# Patient Record
Sex: Male | Born: 1970
Health system: Southern US, Community
[De-identification: ages and names within clinical notes are randomized; demographics above are authoritative.]

## PROBLEM LIST (undated history)

## (undated) DIAGNOSIS — M19041 Primary osteoarthritis, right hand: Secondary | ICD-10-CM

## (undated) DIAGNOSIS — T7840XA Allergy, unspecified, initial encounter: Secondary | ICD-10-CM

## (undated) DIAGNOSIS — G5600 Carpal tunnel syndrome, unspecified upper limb: Secondary | ICD-10-CM

## (undated) DIAGNOSIS — M19042 Primary osteoarthritis, left hand: Secondary | ICD-10-CM

## (undated) DIAGNOSIS — G4700 Insomnia, unspecified: Secondary | ICD-10-CM

## (undated) HISTORY — PX: CHOLECYSTECTOMY: SHX55

## (undated) HISTORY — DX: Primary osteoarthritis, right hand: M19.041

## (undated) HISTORY — DX: Insomnia, unspecified: G47.00

## (undated) HISTORY — DX: Allergy, unspecified, initial encounter: T78.40XA

## (undated) HISTORY — DX: Primary osteoarthritis, left hand: M19.042

---

## 2016-07-28 ENCOUNTER — Ambulatory Visit (HOSPITAL_COMMUNITY)
Admission: EM | Admit: 2016-07-28 | Discharge: 2016-07-28 | Disposition: A | Discharge status: Home / Self Care | Payer: Self-pay | Attending: Family Medicine | Admitting: Family Medicine

## 2016-07-28 ENCOUNTER — Encounter (HOSPITAL_COMMUNITY): Payer: Self-pay | Admitting: Emergency Medicine

## 2016-07-28 DIAGNOSIS — R0789 Other chest pain: Secondary | ICD-10-CM

## 2016-07-28 DIAGNOSIS — G4700 Insomnia, unspecified: Secondary | ICD-10-CM

## 2016-07-28 MED ORDER — HYDROXYZINE HCL 25 MG PO TABS: ORAL_TABLET | ORAL | 0 refills | Status: DC

## 2016-07-28 MED ORDER — NAPROXEN 500 MG PO TABS: 500.0000 mg | ORAL_TABLET | Freq: Two times a day (BID) | ORAL | 0 refills | Status: DC

## 2016-07-28 NOTE — ED Triage Notes (Signed)
Patient reports intermittent left chest pain.  Pain started 3 days ago.  Patient reports if he lies on his left side, he feels pain.  Patient says if he lies on right side, he does not feel any pain.  Patient has had nausea yesterday, but no vomiting.  Denies amy history of this prior to 3 days ago.

## 2016-07-28 NOTE — ED Provider Notes (Signed)
CSN: [id number]     Arrival date & time 07/28/16  1659 History   None    Chief Complaint  Patient presents with  . Chest Pain   (Consider location/radiation/quality/duration/timing/severity/associated sxs/prior Treatment) Patient c/o left sided chest pain that is worse with laying on left side or movement.  He has been having difficulty getting a job since moving from [place] and has been out of work for 3 months and he is having anxiety and difficulty sleeping.   The history is provided by the patient.  Chest Pain  Pain location:  L chest and L lateral chest Pain quality: aching   Pain radiates to:  Does not radiate Pain severity:  Moderate Onset quality:  Sudden Duration:  3 days Timing:  Intermittent Progression:  Worsening Chronicity:  New Context: movement   Relieved by:  Rest Worsened by:  Nothing Ineffective treatments:  Rest   History reviewed. No pertinent past medical history. History reviewed. No pertinent surgical history. No family history on file. Social History  Substance Use Topics  . Smoking status: Never Smoker  . Smokeless tobacco: Never Used  . Alcohol use No    Review of Systems  Constitutional: Negative.   HENT: Negative.   Eyes: Negative.   Respiratory: Negative.   Cardiovascular: Positive for chest pain.  Gastrointestinal: Negative.   Endocrine: Negative.   Genitourinary: Negative.   Musculoskeletal: Positive for myalgias.  Skin: Negative.   Allergic/Immunologic: Negative.   Neurological: Negative.   Hematological: Negative.   Psychiatric/Behavioral: Negative.     Allergies  Review of patient's allergies indicates no known allergies.  Home Medications   Prior to Admission medications   Medication Sig Start Date End Date Taking? Authorizing Provider  hydrOXYzine (ATARAX/VISTARIL) 25 MG tablet One to two po qhs prn insomnia 07/28/16   Deatra CanterWilliam J Armarion Greek, FNP  naproxen (NAPROSYN) 500 MG tablet Take 1 tablet (500 mg total) by mouth  2 (two) times daily with a meal. 07/28/16   Deatra CanterWilliam J Mercadez Heitman, FNP   Meds Ordered and Administered this Visit  Medications - No data to display  BP 141/82 (BP Location: Left Arm)   Pulse 74   Temp 98.5 F (36.9 C) (Oral)   Resp 18   SpO2 97%  No data found.   Physical Exam  Constitutional: He appears well-developed and well-nourished.  HENT:  Head: Normocephalic and atraumatic.  Eyes: EOM are normal. Pupils are equal, round, and reactive to light.  Neck: Normal range of motion. Neck supple.  Cardiovascular: Normal rate and regular rhythm.   Pulmonary/Chest: Effort normal and breath sounds normal.  Abdominal: Soft. Bowel sounds are normal.  Musculoskeletal: He exhibits tenderness.  Left intercostal muscle tenderness.  Nursing note and vitals reviewed.   Urgent Care Course   Clinical Course    Procedures (including critical care time)  Labs Review Labs Reviewed - No data to display  Imaging Review No results found.   Visual Acuity Review  Right Eye Distance:   Left Eye Distance:   Bilateral Distance:    Right Eye Near:   Left Eye Near:    Bilateral Near:        MDM   1. Left-sided chest wall pain   2. Insomnia   EKG NSR without ST-T changes Naprosyn 500mg  one po bid x 10 days #20 Hydroxyzine 25mg  one to two po qhs prn insomnia #24 Need to get PCP    Deatra CanterWilliam J Tydus Sanmiguel, FNP 07/28/16 1829

## 2016-08-05 ENCOUNTER — Ambulatory Visit: Payer: Self-pay | Attending: Family Medicine | Admitting: Family Medicine

## 2016-08-05 ENCOUNTER — Encounter: Payer: Self-pay | Admitting: Family Medicine

## 2016-08-05 DIAGNOSIS — G4700 Insomnia, unspecified: Secondary | ICD-10-CM

## 2016-08-05 DIAGNOSIS — Z79899 Other long term (current) drug therapy: Secondary | ICD-10-CM | POA: Insufficient documentation

## 2016-08-05 DIAGNOSIS — R079 Chest pain, unspecified: Secondary | ICD-10-CM | POA: Insufficient documentation

## 2016-08-05 DIAGNOSIS — R0789 Other chest pain: Secondary | ICD-10-CM | POA: Insufficient documentation

## 2016-08-05 HISTORY — DX: Insomnia, unspecified: G47.00

## 2016-08-05 MED ORDER — NAPROXEN 500 MG PO TABS: 500.0000 mg | ORAL_TABLET | Freq: Two times a day (BID) | ORAL | 1 refills | Status: DC

## 2016-08-05 MED ORDER — HYDROXYZINE HCL 25 MG PO TABS: ORAL_TABLET | ORAL | 1 refills | Status: DC

## 2016-08-05 MED FILL — hydrOXYzine HCL 25 MG TABS: 25 | 15 days supply | Qty: 30 | Fill #0

## 2016-08-05 MED FILL — NAPROXEN 500 MG TABLET: 500 | 15 days supply | Qty: 30 | Fill #0

## 2016-08-05 NOTE — Patient Instructions (Signed)

## 2016-08-05 NOTE — Progress Notes (Signed)
Subjective:  Patient ID: Roberto Moore, male    DOB: [birth year]  Age: 45 y.o. MRN: [id number]  CC: Anxiety and Insomnia   HPI Roberto Moore is a 45 year old male who relocated from [place] one month ago and was seen at urgent care for intermittent chest pains and insomnia. He received a prescription for hydroxyzine and naproxen which he is yet to fill. He described chest pains as pinches on the left side of his chest with her intermittent and have resulted the use of aspirin. Denies shortness of breath.Denies chest pain at this time. He has placed several job applications but has been informed that he is over qualified and he is currently stressed and anxious which causes him to loose sleep.  History reviewed. No pertinent past medical history.  History reviewed. No pertinent surgical history.  No Known Allergies   Outpatient Medications Prior to Visit  Medication Sig Dispense Refill  . hydrOXYzine (ATARAX/VISTARIL) 25 MG tablet One to two po qhs prn insomnia (Patient not taking: Reported on 08/05/2016) 24 tablet 0  . naproxen (NAPROSYN) 500 MG tablet Take 1 tablet (500 mg total) by mouth 2 (two) times daily with a meal. (Patient not taking: Reported on 08/05/2016) 20 tablet 0   No facility-administered medications prior to visit.     ROS Review of Systems  Constitutional: Negative for activity change and appetite change.  HENT: Negative for sinus pressure and sore throat.   Eyes: Negative for visual disturbance.  Respiratory: Negative for cough, chest tightness and shortness of breath.   Cardiovascular: Negative for chest pain and leg swelling.  Gastrointestinal: Negative for abdominal distention, abdominal pain, constipation and diarrhea.  Endocrine: Negative.   Genitourinary: Negative for dysuria.  Musculoskeletal: Negative for joint swelling and myalgias.  Skin: Negative for rash.  Allergic/Immunologic: Negative.   Neurological: Negative for weakness, light-headedness  and numbness.  Psychiatric/Behavioral: Negative for dysphoric mood and suicidal ideas.    Objective:  BP 111/71 (BP Location: Right Arm, Patient Position: Sitting, Cuff Size: Large)   Pulse (!) 56   Temp 98.3 F (36.8 C) (Oral)   Ht 5' 8.75" (1.746 m)   Wt 193 lb 6.4 oz (87.7 kg)   SpO2 98%   BMI 28.77 kg/m   BP/Weight 08/05/2016 07/28/2016  Systolic BP 111 141  Diastolic BP 71 82  Wt. (Lbs) 193.4 -  BMI 28.77 -      Physical Exam  Constitutional: He is oriented to person, place, and time. He appears well-developed and well-nourished.  Cardiovascular: Normal heart sounds and intact distal pulses.  Bradycardia present.   No murmur heard. Pulmonary/Chest: Effort normal and breath sounds normal. He has no wheezes. He has no rales. He exhibits no tenderness.  Abdominal: Soft. Bowel sounds are normal. He exhibits no distension and no mass. There is no tenderness.  Musculoskeletal: Normal range of motion.  Neurological: He is alert and oriented to person, place, and time.     Assessment & Plan:   1. Insomnia Underlying anxiety involved with job hunting is a major stressor - hydrOXYzine (ATARAX/VISTARIL) 25 MG tablet; One to two po qhs prn insomnia  Dispense: 30 tablet; Refill: 1  2. Chest pain, musculoskeletal Absent at this time - naproxen (NAPROSYN) 500 MG tablet; Take 1 tablet (500 mg total) by mouth 2 (two) times daily with a meal.  Dispense: 30 tablet; Refill: 1   Meds ordered this encounter  Medications  . hydrOXYzine (ATARAX/VISTARIL) 25 MG tablet    Sig: One to two  po qhs prn insomnia    Dispense:  30 tablet    Refill:  1  . naproxen (NAPROSYN) 500 MG tablet    Sig: Take 1 tablet (500 mg total) by mouth 2 (two) times daily with a meal.    Dispense:  30 tablet    Refill:  1    Follow-up: Return in about 1 month (around 09/05/2016) for follow up on chest pain.   Jaclyn ShaggyEnobong Amao MD

## 2016-08-05 NOTE — Progress Notes (Signed)
Was unable to fill either of these medications that he obtained from Urgent Care.

## 2016-11-13 ENCOUNTER — Ambulatory Visit: Payer: Self-pay | Attending: Family Medicine | Admitting: Family Medicine

## 2016-11-13 ENCOUNTER — Encounter: Payer: Self-pay | Admitting: Family Medicine

## 2016-11-13 VITALS — BP 118/71 | HR 58 | Temp 98.2°F | Ht 68.75 in | Wt 190.4 lb

## 2016-11-13 DIAGNOSIS — M25512 Pain in left shoulder: Secondary | ICD-10-CM | POA: Insufficient documentation

## 2016-11-13 DIAGNOSIS — H538 Other visual disturbances: Secondary | ICD-10-CM | POA: Insufficient documentation

## 2016-11-13 DIAGNOSIS — Z13228 Encounter for screening for other metabolic disorders: Secondary | ICD-10-CM

## 2016-11-13 LAB — COMPLETE METABOLIC PANEL WITH GFR
ALBUMIN: 4.5 g/dL (ref 3.6–5.1)
ALK PHOS: 127 U/L — AB (ref 40–115)
ALT: 62 U/L — ABNORMAL HIGH (ref 9–46)
AST: 38 U/L (ref 10–40)
BUN: 14 mg/dL (ref 7–25)
CALCIUM: 11 mg/dL — AB (ref 8.6–10.3)
CO2: 27 mmol/L (ref 20–31)
Chloride: 106 mmol/L (ref 98–110)
Creat: 1.22 mg/dL (ref 0.60–1.35)
GFR, EST AFRICAN AMERICAN: 81 mL/min (ref >=60)
GFR, Est Non African American: 70 mL/min (ref >=60)
Glucose, Bld: 112 mg/dL — ABNORMAL HIGH (ref 65–99)
POTASSIUM: 5 mmol/L (ref 3.5–5.3)
Sodium: 139 mmol/L (ref 135–146)
Total Bilirubin: 1.3 mg/dL — ABNORMAL HIGH (ref 0.2–1.2)
Total Protein: 7.8 g/dL (ref 6.1–8.1)

## 2016-11-13 LAB — LIPID PANEL
CHOL/HDL RATIO: 3.5 ratio (ref <5.0)
CHOLESTEROL: 164 mg/dL (ref <200)
HDL: 47 mg/dL (ref >40)
LDL Cholesterol: 102 mg/dL — ABNORMAL HIGH (ref <100)
TRIGLYCERIDES: 77 mg/dL (ref <150)
VLDL: 15 mg/dL (ref <30)

## 2016-11-13 MED ORDER — CYCLOBENZAPRINE HCL 10 MG PO TABS: 10.0000 mg | ORAL_TABLET | Freq: Two times a day (BID) | ORAL | 0 refills | Status: DC | PRN

## 2016-11-13 MED FILL — CYCLOBENZAPRINE 10 MG TAB: 10 | 30 days supply | Qty: 60 | Fill #0

## 2016-11-13 NOTE — Progress Notes (Signed)
Subjective:  Patient ID: Roberto Moore, male    DOB: 1971/12/05  Age: 45 y.o. MRN: [id number]  CC: difficulty seeing (referral to opth) and Shoulder Pain (left side)   HPI Roberto Moore presents for Regular checkup. He complains of pain in his left shoulder for the last 1 week and is unable to recall a history of heavy lifting. Pain is more with movement of his left arm. He has also noticed blurry vision for the last 1 month and has to use a magnifying glass to see. Denies headaches, nausea or vomiting.  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 He has no other concerns today.  History reviewed. No pertinent past medical history.  History reviewed. No pertinent surgical history.  No Known Allergies    Outpatient Medications Prior to Visit  Medication Sig Dispense Refill  . hydrOXYzine (ATARAX/VISTARIL) 25 MG tablet One to two po qhs prn insomnia 30 tablet 1  . naproxen (NAPROSYN) 500 MG tablet Take 1 tablet (500 mg total) by mouth 2 (two) times daily with a meal. 30 tablet 1   No facility-administered medications prior to visit.     ROS Review of Systems  Constitutional: Negative for activity change and appetite change.  HENT: Negative for sinus pressure and sore throat.   Eyes: Positive for visual disturbance.  Respiratory: Negative for chest tightness, shortness of breath and wheezing.   Cardiovascular: Negative for chest pain and palpitations.  Gastrointestinal: Negative for abdominal distention, abdominal pain and constipation.  Genitourinary: Negative.   Musculoskeletal:       See history of present illness  Psychiatric/Behavioral: Negative for behavioral problems and dysphoric mood.    Objective:  BP 118/71 (BP Location: Right Arm, Patient Position: Sitting, Cuff Size: Large)   Pulse (!) 58   Temp 98.2 F (36.8 C) (Oral)   Ht 5' 8.75" (1.746 m)   Wt 190 lb 6.4 oz (86.4 kg)   SpO2 100%    BMI 28.32 kg/m   BP/Weight 11/13/2016 08/05/2016 07/28/2016  Systolic BP 118 111 141  Diastolic BP 71 71 82  Wt. (Lbs) 190.4 193.4 -  BMI 28.32 28.77 -      Physical Exam  Constitutional: He is oriented to person, place, and time. He appears well-developed and well-nourished.  Cardiovascular: Normal rate, normal heart sounds and intact distal pulses.   No murmur heard. Pulmonary/Chest: Effort normal and breath sounds normal. He has no wheezes. He has no rales. He exhibits no tenderness.  Abdominal: Soft. Bowel sounds are normal. He exhibits no distension and no mass. There is no tenderness.  Musculoskeletal: He exhibits tenderness (tenderness on palpation of pectoralis major muscle and on range of motion of left arm). He exhibits no edema.  Neurological: He is alert and oriented to person, place, and time.     Assessment & Plan:   1. Pain in joint of left shoulder Likely muscle spasm - cyclobenzaprine (FLEXERIL) 10 MG tablet; Take 1 tablet (10 mg total) by mouth 2 (two) times daily as needed for muscle spasms.  Dispense: 60 tablet; Refill: 0  2. Screening for metabolic disorder  - COMPLETE METABOLIC PANEL WITH GFR - Lipid panel  3. Blurry vision, bilateral Unable to refer to ophthalmology as he has no medical coverage We have discussed community resources for eye exams and the patient will be seeking out one of this.   Meds ordered this encounter  Medications  . cyclobenzaprine (  FLEXERIL) 10 MG tablet    Sig: Take 1 tablet (10 mg total) by mouth 2 (two) times daily as needed for muscle spasms.    Dispense:  60 tablet    Refill:  0    Follow-up: Return in about 6 months (around 05/13/2017) for Coordination of care.   Jaclyn ShaggyEnobong Amao MD

## 2016-11-17 ENCOUNTER — Telehealth: Payer: Self-pay | Admitting: Family Medicine

## 2016-11-17 ENCOUNTER — Telehealth: Payer: Self-pay

## 2016-11-17 NOTE — Telephone Encounter (Signed)
Patient called and is aware of lab results. Patient verbalized understanding and will communicate with nurse for further questions.

## 2016-11-17 NOTE — Telephone Encounter (Signed)
-----   Message from Jaclyn ShaggyEnobong Amao, MD sent at 11/14/2016  1:50 PM EST ----- Cholesterol is normal. Sugar is slightly elevated and he is advised to work on exercise, weight loss to prevent development of diabetes. One of his liver enzymes is also elevated ; we will repeat this at his next visit.

## 2016-11-17 NOTE — Telephone Encounter (Signed)
Writer LVM requesting a call back from patient regarding lab results.

## 2016-11-18 NOTE — Telephone Encounter (Signed)
rec'd message from [place].

## 2017-06-19 ENCOUNTER — Encounter: Payer: Self-pay | Admitting: Family Medicine

## 2017-07-14 ENCOUNTER — Ambulatory Visit: Payer: Self-pay | Attending: Family Medicine | Admitting: Family Medicine

## 2017-07-14 ENCOUNTER — Encounter: Payer: Self-pay | Admitting: Family Medicine

## 2017-07-14 VITALS — BP 121/86 | HR 58 | Temp 97.7°F | Resp 18 | Ht 68.0 in | Wt 178.0 lb

## 2017-07-14 DIAGNOSIS — Z13228 Encounter for screening for other metabolic disorders: Secondary | ICD-10-CM

## 2017-07-14 DIAGNOSIS — Z79899 Other long term (current) drug therapy: Secondary | ICD-10-CM | POA: Insufficient documentation

## 2017-07-14 DIAGNOSIS — G5601 Carpal tunnel syndrome, right upper limb: Secondary | ICD-10-CM

## 2017-07-14 DIAGNOSIS — Z114 Encounter for screening for human immunodeficiency virus [HIV]: Secondary | ICD-10-CM

## 2017-07-14 DIAGNOSIS — Z23 Encounter for immunization: Secondary | ICD-10-CM

## 2017-07-14 DIAGNOSIS — M19041 Primary osteoarthritis, right hand: Secondary | ICD-10-CM | POA: Insufficient documentation

## 2017-07-14 DIAGNOSIS — M19042 Primary osteoarthritis, left hand: Secondary | ICD-10-CM

## 2017-07-14 DIAGNOSIS — G5600 Carpal tunnel syndrome, unspecified upper limb: Secondary | ICD-10-CM | POA: Insufficient documentation

## 2017-07-14 HISTORY — DX: Primary osteoarthritis, right hand: M19.041

## 2017-07-14 MED ORDER — PREDNISONE 20 MG PO TABS: 20.0000 mg | ORAL_TABLET | Freq: Two times a day (BID) | ORAL | 0 refills | Status: DC

## 2017-07-14 MED ORDER — NAPROXEN 500 MG PO TABS: 500.0000 mg | ORAL_TABLET | Freq: Two times a day (BID) | ORAL | 2 refills | Status: DC

## 2017-07-14 MED FILL — NAPROXEN 500 MG TABLET: 500 | 30 days supply | Qty: 60 | Fill #0

## 2017-07-14 MED FILL — ?PREDNISONE 20 MG TABLET: 20 | 5 days supply | Qty: 10 | Fill #0

## 2017-07-14 NOTE — Progress Notes (Signed)
Subjective:  Patient ID: Roberto Moore, male    DOB: 10/28/1971  Age: 46 y.o. MRN: [id number]  CC: Hand Pain   HPI Roberto Moore presents today with complaints of intermittent pain in both hands with associated swelling which is worse in his right hand.  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 He has used a wrist brace with no relief in symptoms and sometimes has stiffening of the joints of his hand and he is able to straighten out his fingers by placing them against the wall. He has noticed that intake of pineapple juice does help symptoms. Also has shooting pain in his fingers and also numbness in his fingertips occasionally.  He is fasting today in anticipation of labs.  No past medical history on file.  No past surgical history on file.  No Known Allergies   Outpatient Medications Prior to Visit  Medication Sig Dispense Refill  . cyclobenzaprine (FLEXERIL) 10 MG tablet Take 1 tablet (10 mg total) by mouth 2 (two) times daily as needed for muscle spasms. 60 tablet 0  . hydrOXYzine (ATARAX/VISTARIL) 25 MG tablet One to two po qhs prn insomnia (Patient not taking: Reported on 07/14/2017) 30 tablet 1  . naproxen (NAPROSYN) 500 MG tablet Take 1 tablet (500 mg total) by mouth 2 (two) times daily with a meal. (Patient not taking: Reported on 07/14/2017) 30 tablet 1   No facility-administered medications prior to visit.     ROS Review of Systems  Constitutional: Negative for activity change and appetite change.  HENT: Negative for sinus pressure and sore throat.   Eyes: Negative for visual disturbance.  Respiratory: Negative for cough, chest tightness and shortness of breath.   Cardiovascular: Negative for chest pain and leg swelling.  Gastrointestinal: Negative for abdominal distention, abdominal pain, constipation and diarrhea.  Endocrine: Negative.   Genitourinary: Negative for dysuria.  Musculoskeletal: Negative for  joint swelling and myalgias.  Skin: Negative for rash.  Allergic/Immunologic: Negative.   Neurological: Negative for weakness, light-headedness and numbness.  Psychiatric/Behavioral: Negative for dysphoric mood and suicidal ideas.    Objective:  BP 121/86 (BP Location: Left Arm, Patient Position: Sitting, Cuff Size: Normal)   Pulse (!) 58   Temp 97.7 F (36.5 C) (Oral)   Resp 18   Ht _0  (1.727 m)   Wt 178 lb (80.7 kg)   SpO2 97%   BMI 27.06 kg/m   BP/Weight 07/14/2017 11/13/2016 3/33/5456  Systolic BP 256 389 373  Diastolic BP 86 71 71  Wt. (Lbs) 178 190.4 193.4  BMI 27.06 28.32 28.77      Physical Exam  Constitutional: He is oriented to person, place, and time. He appears well-developed and well-nourished.  Cardiovascular: Normal rate, normal heart sounds and intact distal pulses.   No murmur heard. Pulmonary/Chest: Effort normal and breath sounds normal. He has no wheezes. He has no rales. He exhibits no tenderness.  Abdominal: Soft. Bowel sounds are normal. He exhibits no distension and no mass. There is no tenderness.  Musculoskeletal:  Unable to make a complete fist in right hand Slightly edematous PIP joints with bouchard's nodules. Positive Phalen's sign on the right.  Neurological: He is alert and oriented to person, place, and time.     Assessment & Plan:   1. Primary osteoarthritis of both hands Advised to use NSAIDS after completion of Prednisone - naproxen (NAPROSYN) 500 MG tablet; Take 1 tablet (500  mg total) by mouth 2 (two) times daily with a meal.  Dispense: 60 tablet; Refill: 2 - predniSONE (DELTASONE) 20 MG tablet; Take 1 tablet (20 mg total) by mouth 2 (two) times daily with a meal.  Dispense: 10 tablet; Refill: 0  2. Carpal tunnel syndrome of right wrist Due to repetitive hand motions  3. Need for Tdap vaccination Tdap administered  4. Screening for metabolic disorder - [id number] - Lipid panel  5. Screening for HIV (human  immunodeficiency virus) - HIV antibody (with reflex)   Meds ordered this encounter  Medications  . naproxen (NAPROSYN) 500 MG tablet    Sig: Take 1 tablet (500 mg total) by mouth 2 (two) times daily with a meal.    Dispense:  60 tablet    Refill:  2  . predniSONE (DELTASONE) 20 MG tablet    Sig: Take 1 tablet (20 mg total) by mouth 2 (two) times daily with a meal.    Dispense:  10 tablet    Refill:  0    Follow-up: Return in about 4 months (around 11/13/2017) for Follow-up on osteoarthritis and carpal tunnel.  This note has been created with Surveyor, quantity. Any transcriptional errors are unintentional.     Arnoldo Morale MD

## 2017-07-14 NOTE — Progress Notes (Signed)
Patient has not eaten Patient has not had any medication  Patient has finished all medication

## 2017-07-14 NOTE — Patient Instructions (Signed)
Carpal Tunnel Syndrome Carpal tunnel syndrome is a condition that causes pain in your hand and arm. The carpal tunnel is a narrow area located on the palm side of your wrist. Repeated wrist motion or certain diseases may cause swelling within the tunnel. This swelling pinches the main nerve in the wrist (median nerve). What are the causes? This condition may be caused by:  Repeated wrist motions.  Wrist injuries.  Arthritis.  A cyst or tumor in the carpal tunnel.  Fluid buildup during pregnancy.  Sometimes the cause of this condition is not known. What increases the risk? This condition is more likely to develop in:  People who have jobs that cause them to repeatedly move their wrists in the same motion, such as butchers and cashiers.  Women.  People with certain conditions, such as: ? Diabetes. ? Obesity. ? An underactive thyroid (hypothyroidism). ? Kidney failure.  What are the signs or symptoms? Symptoms of this condition include:  A tingling feeling in your fingers, especially in your thumb, index, and middle fingers.  Tingling or numbness in your hand.  An aching feeling in your entire arm, especially when your wrist and elbow are bent for long periods of time.  Wrist pain that goes up your arm to your shoulder.  Pain that goes down into your palm or fingers.  A weak feeling in your hands. You may have trouble grabbing and holding items.  Your symptoms may feel worse during the night. How is this diagnosed? This condition is diagnosed with a medical history and physical exam. You may also have tests, including:  An electromyogram (EMG). This test measures electrical signals sent by your nerves into the muscles.  X-rays.  How is this treated? Treatment for this condition includes:  Lifestyle changes. It is important to stop doing or modify the activity that caused your condition.  Physical or occupational therapy.  Medicines for pain and inflammation.  This may include medicine that is injected into your wrist.  A wrist splint.  Surgery.  Follow these instructions at home: If you have a splint:  Wear it as told by your health care provider. Remove it only as told by your health care provider.  Loosen the splint if your fingers become numb and tingle, or if they turn cold and blue.  Keep the splint clean and dry. General instructions  Take over-the-counter and prescription medicines only as told by your health care provider.  Rest your wrist from any activity that may be causing your pain. If your condition is work related, talk to your employer about changes that can be made, such as getting a wrist pad to use while typing.  If directed, apply ice to the painful area: ? Put ice in a plastic bag. ? Place a towel between your skin and the bag. ? Leave the ice on for 20 minutes, 2-3 times per day.  Keep all follow-up visits as told by your health care provider. This is important.  Do any exercises as told by your health care provider, physical therapist, or occupational therapist. Contact a health care provider if:  You have new symptoms.  Your pain is not controlled with medicines.  Your symptoms get worse. This information is not intended to replace advice given to you by your health care provider. Make sure you discuss any questions you have with your health care provider. Document Released: 11/28/2000 Document Revised: 04/10/2016 Document Reviewed: 04/18/2015 Elsevier Interactive Patient Education  2017 [employer]  

## 2017-07-15 ENCOUNTER — Encounter: Payer: Self-pay | Admitting: Family Medicine

## 2017-07-15 LAB — CMP14+EGFR
A/G RATIO: 1.6 (ref 1.2–2.2)
ALBUMIN: 4.6 g/dL (ref 3.5–5.5)
ALT: 21 IU/L (ref 0–44)
AST: 19 IU/L (ref 0–40)
Alkaline Phosphatase: 126 IU/L — ABNORMAL HIGH (ref 39–117)
BUN / CREAT RATIO: 22 — AB (ref 9–20)
BUN: 19 mg/dL (ref 6–24)
Bilirubin Total: 1.6 mg/dL — ABNORMAL HIGH (ref 0.0–1.2)
CALCIUM: 11 mg/dL — AB (ref 8.7–10.2)
CO2: 20 mmol/L (ref 20–29)
Chloride: 107 mmol/L — ABNORMAL HIGH (ref 96–106)
Creatinine, Ser: 0.86 mg/dL (ref 0.76–1.27)
GFR, EST AFRICAN AMERICAN: 119 mL/min/{1.73_m2} (ref >59)
GFR, EST NON AFRICAN AMERICAN: 103 mL/min/{1.73_m2} (ref >59)
GLOBULIN, TOTAL: 2.8 g/dL (ref 1.5–4.5)
Glucose: 114 mg/dL — ABNORMAL HIGH (ref 65–99)
POTASSIUM: 4.9 mmol/L (ref 3.5–5.2)
Sodium: 140 mmol/L (ref 134–144)
TOTAL PROTEIN: 7.4 g/dL (ref 6.0–8.5)

## 2017-07-15 LAB — LIPID PANEL
CHOL/HDL RATIO: 2.7 ratio (ref 0.0–5.0)
Cholesterol, Total: 147 mg/dL (ref 100–199)
HDL: 54 mg/dL (ref >39)
LDL Calculated: 82 mg/dL (ref 0–99)
Triglycerides: 55 mg/dL (ref 0–149)
VLDL Cholesterol Cal: 11 mg/dL (ref 5–40)

## 2017-07-15 LAB — HIV ANTIBODY (ROUTINE TESTING W REFLEX): HIV SCREEN 4TH GENERATION: NONREACTIVE

## 2017-08-03 ENCOUNTER — Encounter: Payer: Self-pay | Admitting: *Deleted

## 2017-09-12 ENCOUNTER — Encounter (HOSPITAL_COMMUNITY): Payer: Self-pay | Admitting: Emergency Medicine

## 2017-09-12 ENCOUNTER — Emergency Department (HOSPITAL_COMMUNITY): Payer: Self-pay

## 2017-09-12 ENCOUNTER — Emergency Department (HOSPITAL_COMMUNITY)
Admission: EM | Admit: 2017-09-12 | Discharge: 2017-09-13 | Disposition: A | Discharge status: Home / Self Care | Payer: Self-pay | Attending: Emergency Medicine | Admitting: Emergency Medicine

## 2017-09-12 DIAGNOSIS — M542 Cervicalgia: Secondary | ICD-10-CM | POA: Insufficient documentation

## 2017-09-12 DIAGNOSIS — Z79899 Other long term (current) drug therapy: Secondary | ICD-10-CM | POA: Insufficient documentation

## 2017-09-12 DIAGNOSIS — M25561 Pain in right knee: Secondary | ICD-10-CM | POA: Insufficient documentation

## 2017-09-12 MED ORDER — NAPROXEN 500 MG PO TABS
500.0000 mg | ORAL_TABLET | Freq: Once | ORAL | Status: AC
  Administered 2017-09-12: 500 mg via ORAL
  Filled 2017-09-12: qty 1

## 2017-09-12 MED ORDER — METHOCARBAMOL 500 MG PO TABS
500.0000 mg | ORAL_TABLET | Freq: Once | ORAL | Status: AC
  Administered 2017-09-12: 500 mg via ORAL
  Filled 2017-09-12: qty 1

## 2017-09-12 MED ORDER — METHOCARBAMOL 500 MG PO TABS: 500.0000 mg | ORAL_TABLET | Freq: Two times a day (BID) | ORAL | 0 refills | Status: DC | PRN
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
## 2017-09-15 ENCOUNTER — Telehealth: Payer: Self-pay | Admitting: Family Medicine

## 2017-09-15 MED FILL — METHOCARBAMOL 500 MG TABS: 500 | 10 days supply | Qty: 20 | Fill #0

## 2017-09-15 NOTE — Telephone Encounter (Signed)
Pt has been called several times and there is no VM set up to leave a message.

## 2017-09-15 NOTE — Telephone Encounter (Signed)
Pt came to the office to get the lab result, please call him back

## 2017-10-10 ENCOUNTER — Encounter (HOSPITAL_COMMUNITY): Payer: Self-pay | Admitting: *Deleted

## 2017-10-10 ENCOUNTER — Ambulatory Visit (HOSPITAL_COMMUNITY)
Admission: EM | Admit: 2017-10-10 | Discharge: 2017-10-10 | Disposition: A | Discharge status: Home / Self Care | Payer: Self-pay | Attending: Family Medicine | Admitting: Family Medicine

## 2017-10-10 DIAGNOSIS — M79641 Pain in right hand: Secondary | ICD-10-CM

## 2017-10-10 HISTORY — DX: Carpal tunnel syndrome, unspecified upper limb: G56.00

## 2017-10-10 MED ORDER — PREDNISONE 20 MG PO TABS: 40.0000 mg | ORAL_TABLET | Freq: Every day | ORAL | 0 refills | Status: AC

## 2017-10-10 MED ORDER — MELOXICAM 7.5 MG PO TABS: 7.5000 mg | ORAL_TABLET | Freq: Every day | ORAL | 0 refills | Status: DC

## 2017-10-10 NOTE — Discharge Instructions (Signed)
Symptoms consistent with inflammation of the tendons. Start mobic and prednisone as directed. Wrist splint during activity. Ice compress and elevation. Follow up with PCP/orthopedics for further evaluation if symptoms do not improve.

## 2017-10-10 NOTE — ED Triage Notes (Addendum)
Denies injury.  States he is having flare-up of carpal tunnel syndrome RUE.  C/O hand pain with "shooting pains" going up into arm; tingling in distal aspect all digits.  Prompt cap refill; fingers warm.  [facility].

## 2017-10-10 NOTE — ED Provider Notes (Signed)
[facility]    CSN: [id number] Arrival date & time: 10/10/17  1204     History   Chief Complaint Chief Complaint  Patient presents with  . Hand Pain    HPI Roberto Moore is a 46 y.o. male.   46 year old male with history of carpal tunnel, osteoarthritis of the hand comes in for evaluation of right hand pain. Patient states symptoms first started 1 year ago, with exacerbation in the past 6 months. However, these past few days, he has had shooting pain to his right hand. He states in the past he has tried naproxen and prednisone with some relief. 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 He states he has intermittent numbness/tingling of his fingers. He has a wrist splint, but unable to wear at work as he could not fit his gloves over the splint. He has some swelling of the wrist/hand. Denies spreading erythema, increased warmth, fever. Denies injury. He has his PCP appointment on 11/9, but states could not wait till then due to the pain.       Past Medical History:  Diagnosis Date  . Carpal tunnel syndrome     Patient Active Problem List   Diagnosis Date Noted  . Hypercalcemia 07/15/2017  . Osteoarthritis of both hands 07/14/2017  . Carpal tunnel syndrome 07/14/2017  . Insomnia 08/05/2016    Past Surgical History:  Procedure Laterality Date  . CHOLECYSTECTOMY         Home Medications    Prior to Admission medications   Medication Sig Start Date End Date Taking? Authorizing Provider  methocarbamol (ROBAXIN) 500 MG tablet Take 1 tablet (500 mg total) by mouth 2 (two) times daily as needed for muscle spasms. 09/12/17  Yes Ward, Chase PicketJaime Pilcher, [name]  naproxen (NAPROSYN) 500 MG tablet Take 1 tablet (500 mg total) by mouth 2 (two) times daily with a meal. 07/14/17  Yes Amao, Enobong, MD  meloxicam (MOBIC) 7.5 MG tablet Take 1 tablet (7.5 mg total) by mouth daily. 10/10/17   Cathie HoopsYu, Amy V, [name]  predniSONE  (DELTASONE) 20 MG tablet Take 2 tablets (40 mg total) by mouth daily. 10/10/17 10/15/17  Belinda FisherYu, Amy V, [name]    Family History No family history on file.  Social History Social History  Substance Use Topics  . Smoking status: Never Smoker  . Smokeless tobacco: Never Used  . Alcohol use No     Allergies   Patient has no known allergies.   Review of Systems Review of Systems  Reason unable to perform ROS: See HPI as above.     Physical Exam Triage Vital Signs ED Triage Vitals [10/10/17 1227]  Enc Vitals Group     BP 124/85     Pulse Rate 62     Resp 16     Temp (!) 97.2 F (36.2 C)     Temp Source Oral     SpO2 97 %     Weight      Height      Head Circumference      Peak Flow      Pain Score 6     Pain Loc      Pain Edu?      Excl. in GC?    No data found.   Updated Vital Signs BP 124/85   Pulse 62   Temp (!) 97.2 F (36.2 C) (Oral)   Resp 16  SpO2 97%   Physical Exam  Constitutional: He is oriented to person, place, and time. He appears well-developed and well-nourished. No distress.  HENT:  Head: Normocephalic and atraumatic.  Eyes: Pupils are equal, round, and reactive to light. Conjunctivae are normal.  Musculoskeletal:  Mild diffuse swelling of the right hand, no erythema, increased warmth. Tenderness on palpation of the radial aspect of the wrist. Tenderness on palpation of DIP/PIP joints. Decreased flexion of the fingers due to pain. Decreased strength due to pain. Sensation intact and equal bilaterally. Radial pulses 2+ and equal bilaterally. Cap refill <2s  Negative tinel's, finkelstein. Positive phalens   Neurological: He is alert and oriented to person, place, and time.     UC Treatments / Results  Labs (all labs ordered are listed, but only abnormal results are displayed) Labs Reviewed - No data to display  EKG  EKG Interpretation None       Radiology No results found.  Procedures Procedures (including critical care  time)  Medications Ordered in UC Medications - No data to display   Initial Impression / Assessment and Plan / UC Course  I have reviewed the triage vital signs and the nursing notes.  Pertinent labs & imaging results that were available during my care of the patient were reviewed by me and considered in my medical decision making (see chart for details).    Symptoms could be due to carpel tunnel, tendinitis, arthritis. Start mobic and prednisone as directed. Wrist splint during activity. Follow up with PCP as scheduled for reevaluation. Orthopedics information provided for further management as needed.   Final Clinical Impressions(s) / UC Diagnoses   Final diagnoses:  Right hand pain    New Prescriptions Discharge Medication List as of 10/10/2017 12:52 PM    START taking these medications   Details  meloxicam (MOBIC) 7.5 MG tablet Take 1 tablet (7.5 mg total) by mouth daily., Starting Sat 10/10/2017, Normal          Belinda Fisher, [place] 10/10/17 1341

## 2017-10-23 ENCOUNTER — Ambulatory Visit: Payer: Self-pay | Attending: Family Medicine | Admitting: Family Medicine

## 2017-10-23 ENCOUNTER — Encounter: Payer: Self-pay | Admitting: Family Medicine

## 2017-10-23 VITALS — BP 107/62 | HR 59 | Temp 98.2°F | Ht 68.0 in | Wt 181.6 lb

## 2017-10-23 DIAGNOSIS — G4700 Insomnia, unspecified: Secondary | ICD-10-CM | POA: Insufficient documentation

## 2017-10-23 DIAGNOSIS — M19042 Primary osteoarthritis, left hand: Secondary | ICD-10-CM | POA: Insufficient documentation

## 2017-10-23 DIAGNOSIS — Z131 Encounter for screening for diabetes mellitus: Secondary | ICD-10-CM

## 2017-10-23 DIAGNOSIS — Z79899 Other long term (current) drug therapy: Secondary | ICD-10-CM | POA: Insufficient documentation

## 2017-10-23 DIAGNOSIS — M19041 Primary osteoarthritis, right hand: Secondary | ICD-10-CM

## 2017-10-23 DIAGNOSIS — G5601 Carpal tunnel syndrome, right upper limb: Secondary | ICD-10-CM

## 2017-10-23 DIAGNOSIS — R7303 Prediabetes: Secondary | ICD-10-CM

## 2017-10-23 DIAGNOSIS — Z833 Family history of diabetes mellitus: Secondary | ICD-10-CM | POA: Insufficient documentation

## 2017-10-23 MED ORDER — MELOXICAM 7.5 MG PO TABS: 7.5000 mg | ORAL_TABLET | Freq: Every day | ORAL | 2 refills | Status: DC

## 2017-10-23 MED FILL — MELOXICAM 7.5 MG TABLET: 7.5 | 30 days supply | Qty: 30 | Fill #0

## 2017-10-23 NOTE — Patient Instructions (Signed)
Prediabetes Eating Plan Prediabetes-also called impaired glucose tolerance or impaired fasting glucose-is a condition that causes blood sugar (blood glucose) levels to be higher than normal. Following a healthy diet can help to keep prediabetes under control. It can also help to lower the risk of type 2 diabetes and heart disease, which are increased in people who have prediabetes. Along with regular exercise, a healthy diet:  Promotes weight loss.  Helps to control blood sugar levels.  Helps to improve the way that the body uses insulin.  What do I need to know about this eating plan?  Use the glycemic index (GI) to plan your meals. The index tells you how quickly a food will raise your blood sugar. Choose low-GI foods. These foods take a longer time to raise blood sugar.  Pay close attention to the amount of carbohydrates in the food that you eat. Carbohydrates increase blood sugar levels.  Keep track of how many calories you take in. Eating the right amount of calories will help you to achieve a healthy weight. Losing about 7 percent of your starting weight can help to prevent type 2 diabetes.  You may want to follow a Mediterranean diet. This diet includes a lot of vegetables, lean meats or fish, whole grains, fruits, and healthy oils and fats. What foods can I eat? Grains Whole grains, such as whole-wheat or whole-grain breads, crackers, cereals, and pasta. Unsweetened oatmeal. Bulgur. Barley. Quinoa. Brown rice. Corn or whole-wheat flour tortillas or taco shells. Vegetables Lettuce. Spinach. Peas. Beets. Cauliflower. Cabbage. Broccoli. Carrots. Tomatoes. Squash. Eggplant. Herbs. Peppers. Onions. Cucumbers. Brussels sprouts. Fruits Berries. Bananas. Apples. Oranges. Grapes. Papaya. Mango. Pomegranate. Kiwi. Grapefruit. Cherries. Meats and Other Protein Sources Seafood. Lean meats, such as chicken and turkey or lean cuts of pork and beef. Tofu. Eggs. Nuts. Beans. Dairy Low-fat or  fat-free dairy products, such as yogurt, cottage cheese, and cheese. Beverages Water. Tea. Coffee. Sugar-free or diet soda. Seltzer water. Milk. Milk alternatives, such as soy or almond milk. Condiments Mustard. Relish. Low-fat, low-sugar ketchup. Low-fat, low-sugar barbecue sauce. Low-fat or fat-free mayonnaise. Sweets and Desserts Sugar-free or low-fat pudding. Sugar-free or low-fat ice cream and other frozen treats. Fats and Oils Avocado. Walnuts. Olive oil. The items listed above may not be a complete list of recommended foods or beverages. Contact your dietitian for more options. What foods are not recommended? Grains Refined white flour and flour products, such as bread, pasta, snack foods, and cereals. Beverages Sweetened drinks, such as sweet iced tea and soda. Sweets and Desserts Baked goods, such as cake, cupcakes, pastries, cookies, and cheesecake. The items listed above may not be a complete list of foods and beverages to avoid. Contact your dietitian for more information. This information is not intended to replace advice given to you by your health care provider. Make sure you discuss any questions you have with your health care provider. Document Released: 04/17/2015 Document Revised: 05/08/2016 Document Reviewed: 12/27/2014 Elsevier Interactive Patient Education  2017 [employer] Inc[employer]  

## 2017-10-23 NOTE — Progress Notes (Signed)
Subjective:  Patient ID: Roberto Moore, male    DOB: [birth year]  Age: 46 y.o. MRN: [id number]  CC: Osteoarthritis   HPI Latravis Grine is a 46 year old right-handed male with a history of osteoarthritis of both hands and associated right carpal tunnel syndrome who presents today for follow-up visit.  He was previously on naproxen but had an exacerbation of his symptoms which led to an urgent care visit 2 weeks ago where he was placed on meloxicam and Robaxin.  He reports improvement in symptoms ever since and also when he uses the brace. Pain is also absent when he has been out of work for some time; at his job he has to cut celery for prolonged hours. He endorses shooting pain from his right middle finger of his forearm to his elbow and intermittent locking of the middle and ring fingers of his right hand. Pain is absent at this time.  Reviewed his previous labs which revealed impaired fasting glucose, hyperbilirubinemia of 1.6 and hypercalcemia of 11.0. He has no known history of diabetes mellitus but endorses a positive family history of diabetes.  Past Medical History:  Diagnosis Date  . Carpal tunnel syndrome   . Insomnia 08/05/2016  . Osteoarthritis of both hands 07/14/2017    Past Surgical History:  Procedure Laterality Date  . CHOLECYSTECTOMY      No Known Allergies   Outpatient Medications Prior to Visit  Medication Sig Dispense Refill  . methocarbamol (ROBAXIN) 500 MG tablet Take 1 tablet (500 mg total) by mouth 2 (two) times daily as needed for muscle spasms. 20 tablet 0  . meloxicam (MOBIC) 7.5 MG tablet Take 1 tablet (7.5 mg total) by mouth daily. 20 tablet 0  . naproxen (NAPROSYN) 500 MG tablet Take 1 tablet (500 mg total) by mouth 2 (two) times daily with a meal. (Patient not taking: Reported on 10/23/2017) 60 tablet 2   No facility-administered medications prior to visit.     ROS Review of Systems  Constitutional: Negative for activity change and appetite  change.  HENT: Negative for sinus pressure and sore throat.   Eyes: Negative for visual disturbance.  Respiratory: Negative for cough, chest tightness and shortness of breath.   Cardiovascular: Negative for chest pain and leg swelling.  Gastrointestinal: Negative for abdominal distention, abdominal pain, constipation and diarrhea.  Endocrine: Negative.   Genitourinary: Negative for dysuria.  Musculoskeletal:       See hpi  Skin: Negative for rash.  Allergic/Immunologic: Negative.   Neurological: Negative for weakness, light-headedness and numbness.  Psychiatric/Behavioral: Negative for dysphoric mood and suicidal ideas.    Objective:  BP 107/62   Pulse (!) 59   Temp 98.2 F (36.8 C) (Oral)   Ht _0  (1.727 m)   Wt 181 lb 9.6 oz (82.4 kg)   SpO2 99%   BMI 27.61 kg/m   BP/Weight 10/23/2017 10/10/2017 03/23/8118  Systolic BP 147 829 562  Diastolic BP 62 85 81  Wt. (Lbs) 181.6 - -  BMI 27.61 - -      Physical Exam  Constitutional: He is oriented to person, place, and time. He appears well-developed and well-nourished.  Cardiovascular: Normal rate, normal heart sounds and intact distal pulses.  No murmur heard. Pulmonary/Chest: Effort normal and breath sounds normal. He has no wheezes. He has no rales. He exhibits no tenderness.  Abdominal: Soft. Bowel sounds are normal. He exhibits no distension and no mass. There is no tenderness.  Musculoskeletal: Normal range of motion. He exhibits no  edema or tenderness.  Bouchard's nodules on PIP Positive Phalen's sign in the right hand  Neurological: He is alert and oriented to person, place, and time.  Psychiatric: He has a normal mood and affect.     Assessment & Plan:   1. Primary osteoarthritis of both hands Intermittent pain - meloxicam (MOBIC) 7.5 MG tablet; Take 1 tablet (7.5 mg total) daily by mouth.  Dispense: 30 tablet; Refill: 2  2. Carpal tunnel syndrome of right wrist Due to persistent right hand movements at his  job Advised to use wrist brace as needed  3. Screening for diabetes mellitus Hyperglycemia on previous labs  4. Hyperbilirubinemia Last bilirubin was 1.6 - CMP14+EGFR  5. Hypercalcemia - PTH, Intact and Calcium  6. Prediabetes A1c of 6.0 Consult on diabetic eating plan and lifestyle modifications to prevent development of diabetes   Meds ordered this encounter  Medications  . meloxicam (MOBIC) 7.5 MG tablet    Sig: Take 1 tablet (7.5 mg total) daily by mouth.    Dispense:  30 tablet    Refill:  2    Follow-up: Return in about 6 months (around 04/22/2018) for Follow-up on prediabetes and carpal tunnel.   Arnoldo Morale MD

## 2019-02-12 ENCOUNTER — Encounter (HOSPITAL_COMMUNITY): Payer: Self-pay

## 2019-02-12 ENCOUNTER — Ambulatory Visit (HOSPITAL_COMMUNITY)
Admission: EM | Admit: 2019-02-12 | Discharge: 2019-02-12 | Disposition: A | Discharge status: Home / Self Care | Payer: Self-pay | Attending: Family Medicine | Admitting: Family Medicine

## 2019-02-12 ENCOUNTER — Other Ambulatory Visit: Payer: Self-pay

## 2019-02-12 DIAGNOSIS — B9789 Other viral agents as the cause of diseases classified elsewhere: Principal | ICD-10-CM

## 2019-02-12 DIAGNOSIS — J069 Acute upper respiratory infection, unspecified: Secondary | ICD-10-CM

## 2019-02-12 DIAGNOSIS — R062 Wheezing: Secondary | ICD-10-CM

## 2019-02-12 MED ORDER — PREDNISONE 10 MG (21) PO TBPK: ORAL_TABLET | Freq: Every day | ORAL | 0 refills | Status: DC

## 2019-02-12 NOTE — ED Triage Notes (Signed)
Pt presents today with cough that has been going on for 4 days. States he has coughed so much that chest has been hurting. States he has had some wheezing and productive cough as well.

## 2019-02-12 NOTE — ED Provider Notes (Signed)
[facility]   [id number] 02/12/19 Arrival Time: 1454  ASSESSMENT & PLAN:  1. Viral URI with cough   2. Wheezing    Meds ordered this encounter  Medications  . predniSONE (STERAPRED UNI-PAK 21 TAB) 10 MG (21) TBPK tablet    Sig: Take by mouth daily. Take as directed.    Dispense:  21 tablet    Refill:  0   OTC symptom care as needed. Ensure adequate fluid intake and rest. May f/u with PCP or here as needed.  Reviewed expectations re: course of current medical issues. Questions answered. Outlined signs and symptoms indicating need for more acute intervention. Patient verbalized understanding. After Visit Summary given.   SUBJECTIVE: History from: patient.  Roberto Moore is a 48 y.o. male who presents with complaint of mild nasal congestion, post-nasal drainage, and a persistent dry cough; without sore throat. Onset abrupt, about 4 days ago; with fatigue and with body aches. These symptoms improving except for the cough which is still lingering. SOB: none. Wheezing: mild at times; worse at night. Fever: questions low grade at symptoms onset; none over the past 48 hours. Overall normal PO intake without n/v. Known sick contacts: no. No specific or significant aggravating or alleviating factors reported. OTC treatment: none.   Social History   Tobacco Use  Smoking Status Never Smoker  Smokeless Tobacco Never Used    ROS: As per HPI.   OBJECTIVE:  Vitals:   02/12/19 1516  BP: 118/76  Pulse: 76  Resp: 16  Temp: 99 F (37.2 C)  TempSrc: Oral  SpO2: 93%     General appearance: alert; appears fatigued HEENT: nasal congestion; clear runny nose; throat irritation secondary to post-nasal drainage Neck: supple without LAD CV: RRR Lungs: unlabored respirations, symmetrical air entry with mild bilateral wheezing; cough: moderate Abd: soft Ext: no LE edema Skin: warm and dry Psychological: alert and cooperative; normal mood and affect   No Known  Allergies  Past Medical History:  Diagnosis Date  . Carpal tunnel syndrome   . Insomnia 08/05/2016  . Osteoarthritis of both hands 07/14/2017  "Bronchitis."  Social History   Socioeconomic History  . Marital status: Single    Spouse name: Not on file  . Number of children: Not on file  . Years of education: Not on file  . Highest education level: Not on file  Occupational History  . Not on file  Social Needs  . Financial resource strain: Not on file  . Food insecurity:    Worry: Not on file    Inability: Not on file  . Transportation needs:    Medical: Not on file    Non-medical: Not on file  Tobacco Use  . Smoking status: Never Smoker  . Smokeless tobacco: Never Used  Substance and Sexual Activity  . Alcohol use: No  . Drug use: No  . Sexual activity: Not on file  Lifestyle  . Physical activity:    Days per week: Not on file    Minutes per session: Not on file  . Stress: Not on file  Relationships  . Social connections:    Talks on phone: Not on file    Gets together: Not on file    Attends religious service: Not on file    Active member of club or organization: Not on file    Attends meetings of clubs or organizations: Not on file    Relationship status: Not on file  . Intimate partner violence:    Fear  of current or ex partner: Not on file    Emotionally abused: Not on file    Physically abused: Not on file    Forced sexual activity: Not on file  Other Topics Concern  . Not on file  Social History Narrative  . Not on file           Mardella Layman, MD 02/23/19 [phone]
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
## 2019-07-30 ENCOUNTER — Other Ambulatory Visit: Payer: Self-pay | Admitting: Radiology

## 2019-07-30 DIAGNOSIS — Z20822 Contact with and (suspected) exposure to covid-19: Secondary | ICD-10-CM

## 2019-07-31 ENCOUNTER — Telehealth: Payer: Self-pay

## 2019-07-31 LAB — NOVEL CORONAVIRUS, NAA: SARS-CoV-2, NAA: DETECTED — AB

## 2019-07-31 NOTE — Telephone Encounter (Signed)
Received call from patient to check Covid results.  Advised patient positive.

## 2019-08-03 ENCOUNTER — Telehealth: Payer: Self-pay | Admitting: *Deleted

## 2019-08-03 NOTE — Telephone Encounter (Signed)
Pt called stating that he had previously tested positive for COVID he would like to know when he can return to work; explained to the pt that the following criteria has to be met: at least 10 days since symptom onset, 3 days fever free without antipyretics, improvement in respiratory symptoms; also advised pt to consult his place of employment, and his PCP for additional guidelines and recommendations; he verbalized understanding.

## 2019-08-04 NOTE — Telephone Encounter (Signed)
He has not been seen in 2 years and needs to call for an appointment.

## 2019-08-08 ENCOUNTER — Telehealth: Payer: Self-pay | Admitting: Family Medicine

## 2019-08-11 ENCOUNTER — Telehealth: Payer: Self-pay | Admitting: Family Medicine

## 2019-08-11 NOTE — Telephone Encounter (Signed)
-----   Message from Carilyn Goodpasture, RN sent at 08/05/2019  2:00 PM EDT ----- Regarding: OV Please schedule tele visit or MyChart for postive covid test for f/u  per Dr. Margarita Rana

## 2019-08-11 NOTE — Telephone Encounter (Signed)
Attempted to reach patient no answer LVM

## 2019-08-24 ENCOUNTER — Ambulatory Visit: Payer: Self-pay | Attending: Family Medicine | Admitting: Family Medicine

## 2019-08-24 ENCOUNTER — Other Ambulatory Visit: Payer: Self-pay

## 2019-08-24 ENCOUNTER — Encounter: Payer: Self-pay | Admitting: Family Medicine

## 2019-08-24 DIAGNOSIS — H5712 Ocular pain, left eye: Secondary | ICD-10-CM

## 2019-08-24 DIAGNOSIS — Z13228 Encounter for screening for other metabolic disorders: Secondary | ICD-10-CM

## 2019-08-24 DIAGNOSIS — Z1211 Encounter for screening for malignant neoplasm of colon: Secondary | ICD-10-CM

## 2019-08-24 NOTE — Progress Notes (Signed)
Patient has been called and DOB has been verified. Patient has been screened and transferred to PCP to start phone visit.    Patient states that he already has the letter from the health department.

## 2019-08-24 NOTE — Progress Notes (Signed)
Virtual Visit via Telephone Note  I connected with Roberto Moore, on 08/24/2019 at 9:04 AM by telephone due to the COVID-19 pandemic and verified that I am speaking with the correct person using two identifiers.   Consent: I discussed the limitations, risks, security and privacy concerns of performing an evaluation and management service by telephone and the availability of in person appointments. I also discussed with the patient that there may be a patient responsible charge related to this service. The patient expressed understanding and agreed to proceed.   Location of Patient: Work  Biomedical scientist of Provider: Clinic   Persons participating in Telemedicine visit: Jennie Bolar Farrington-CMA Dr. Felecia Shelling     History of Present Illness: Roberto Moore presents today for regular follow-up visit.  Last month he was diagnosed with COVID-19 infection but was asymptomatic.  He return to work 1 week ago and feels fine. At the previous visit he had complained of pains in his hands and knees diagnosed as osteoarthritis but reports today hand and knees are doing better. He does have left eye pain with associated headaches and sometimes has decreased vision but no nausea, vomiting or photophobia.  He currently wears glasses and feels he is due for an eye exam.  Past Medical History:  Diagnosis Date  . Carpal tunnel syndrome   . Insomnia 08/05/2016  . Osteoarthritis of both hands 07/14/2017   No Known Allergies  Current Outpatient Medications on File Prior to Visit  Medication Sig Dispense Refill  . meloxicam (MOBIC) 7.5 MG tablet Take 1 tablet (7.5 mg total) daily by mouth. (Patient not taking: Reported on 08/24/2019) 30 tablet 2  . methocarbamol (ROBAXIN) 500 MG tablet Take 1 tablet (500 mg total) by mouth 2 (two) times daily as needed for muscle spasms. (Patient not taking: Reported on 08/24/2019) 20 tablet 0  . predniSONE (STERAPRED UNI-PAK 21 TAB) 10 MG (21) TBPK tablet Take by mouth  daily. Take as directed. (Patient not taking: Reported on 08/24/2019) 21 tablet 0   No current facility-administered medications on file prior to visit.     Observations/Objective: Alert, awake, oriented x3 Not in acute distress  Assessment and Plan: 1. Screening for metabolic disorder - HTX77+SFSE; Future - Lipid panel; Future  2. Screening for colon cancer Strongly advised to apply for the [place] financial discount to facilitate referral for colonoscopy - Fecal occult blood, imunochemical([facility]); Future  3. Left eye pain He will be scheduling an appointment with an ophthalmologist   Follow Up Instructions: Return in about 1 year (around 08/23/2020) for coordination of care.    I discussed the assessment and treatment plan with the patient. The patient was provided an opportunity to ask questions and all were answered. The patient agreed with the plan and demonstrated an understanding of the instructions.   The patient was advised to call back or seek an in-person evaluation if the symptoms worsen or if the condition fails to improve as anticipated.     I provided 10 minutes total of non-face-to-face time during this encounter including median intraservice time, reviewing previous notes, labs, imaging, medications, management and patient verbalized understanding.     Charlott Rakes, MD, FAAFP. [facility] [place], [place]   08/24/2019, 9:04 AM

## 2019-08-26 ENCOUNTER — Other Ambulatory Visit: Payer: Medicaid Other

## 2019-09-06 ENCOUNTER — Ambulatory Visit: Payer: Self-pay | Admitting: Family Medicine

## 2020-05-18 ENCOUNTER — Other Ambulatory Visit: Payer: Self-pay

## 2020-05-18 ENCOUNTER — Emergency Department
Admission: EM | Admit: 2020-05-18 | Discharge: 2020-05-18 | Disposition: A | Discharge status: Home / Self Care | Payer: Medicaid Other | Attending: Emergency Medicine | Admitting: Emergency Medicine

## 2020-05-18 DIAGNOSIS — G5603 Carpal tunnel syndrome, bilateral upper limbs: Secondary | ICD-10-CM | POA: Insufficient documentation

## 2020-05-18 DIAGNOSIS — M19042 Primary osteoarthritis, left hand: Secondary | ICD-10-CM | POA: Insufficient documentation

## 2020-05-18 DIAGNOSIS — M19041 Primary osteoarthritis, right hand: Secondary | ICD-10-CM | POA: Insufficient documentation

## 2020-05-18 MED ORDER — MELOXICAM 7.5 MG PO TABS: 7.5000 mg | ORAL_TABLET | Freq: Every day | ORAL | 0 refills | Status: DC

## 2020-05-18 NOTE — ED Triage Notes (Signed)
Pt states that he has a hx of carpal tunnel and states that the right hand is worse, states that he has been using voltaren ointment but states that he doesn't want to have to use it so frequently

## 2020-05-18 NOTE — ED Notes (Signed)
Pt from home with bilateral hand pain, worse on right side. Pt states he has had this pain x 15 years; uses voltaren, still has pain. Pt has been dx with carpal tunnel in the past. New job has made the condition worse. Pt alert & oriented, nad noted.

## 2020-05-18 NOTE — ED Provider Notes (Signed)
[facility] Emergency Department Provider Note  ____________________________________________  Time seen: Approximately 9:28 AM  I have reviewed the triage vital signs and the nursing notes.   HISTORY  Chief Complaint Hand Pain    HPI Roberto Moore is a 49 y.o. male that presents to the emergency department for evaluation of acute on chronic bilateral hand pain, worse on the left for 2 days. 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 Patient has a history of carpal tunnel and arthritis and has flareups occasionally. His doctor has put him on Mobic in the past, which has improved symptoms. Patient has been using Voltaren gel but gel only helps symptoms for a couple of hours and then they return. No trauma.   Past Medical History:  Diagnosis Date  . Carpal tunnel syndrome   . Insomnia 08/05/2016  . Osteoarthritis of both hands 07/14/2017    Patient Active Problem List   Diagnosis Date Noted  . Prediabetes 10/23/2017  . Hypercalcemia 07/15/2017  . Osteoarthritis of both hands 07/14/2017  . Carpal tunnel syndrome 07/14/2017  . Insomnia 08/05/2016    Past Surgical History:  Procedure Laterality Date  . CHOLECYSTECTOMY      Prior to Admission medications   Medication Sig Start Date End Date Taking? Authorizing Provider  meloxicam (MOBIC) 7.5 MG tablet Take 1 tablet (7.5 mg total) by mouth daily. 05/18/20   Enid Derry, [name]  methocarbamol (ROBAXIN) 500 MG tablet Take 1 tablet (500 mg total) by mouth 2 (two) times daily as needed for muscle spasms. Patient not taking: Reported on 08/24/2019 09/12/17   Ward, Chase Picket, [name]  predniSONE (STERAPRED UNI-PAK 21 TAB) 10 MG (21) TBPK tablet Take by mouth daily. Take as directed. Patient not taking: Reported on 08/24/2019 02/12/19   Mardella Layman, MD    Allergies Patient has no known allergies.  No family history on file.  Social  History Social History   Tobacco Use  . Smoking status: Never Smoker  . Smokeless tobacco: Never Used  Substance Use Topics  . Alcohol use: No  . Drug use: No     Review of Systems  Respiratory: No SOB. Gastrointestinal: No nausea, no vomiting.  Musculoskeletal: Positive bilateral hand pain. Skin: Negative for rash, abrasions, lacerations, ecchymosis. Neurological: Negative for headaches, numbness or tingling   ____________________________________________   PHYSICAL EXAM:  VITAL SIGNS: ED Triage Vitals  Enc Vitals Group     BP 05/18/20 0856 (!) 128/96     Pulse Rate 05/18/20 0856 67     Resp 05/18/20 0856 16     Temp 05/18/20 0856 98.4 F (36.9 C)     Temp Source 05/18/20 0856 Oral     SpO2 05/18/20 0856 97 %     Weight 05/18/20 0856 185 lb (83.9 kg)     Height 05/18/20 0856 5' 8.75" (1.746 m)     Head Circumference --      Peak Flow --      Pain Score 05/18/20 0900 5     Pain Loc --      Pain Edu? --      Excl. in GC? --      Constitutional: Alert and oriented. Well appearing and in no acute distress. Eyes: Conjunctivae are normal. PERRL. EOMI. Head: Atraumatic. ENT:      Ears:      Nose: No congestion/rhinnorhea.      Mouth/Throat: Mucous membranes are  moist.  Neck: No stridor.   Cardiovascular: Normal rate, regular rhythm.  Good peripheral circulation. Symmetric radial pulses bilaterally. Respiratory: Normal respiratory effort without tachypnea or retractions. Lungs CTAB. Good air entry to the bases with no decreased or absent breath sounds. Musculoskeletal: Full range of motion to all extremities. No gross deformities appreciated. Mild swelling to right knuckles. No overlying skin changes. Full range of motion of bilateral hands. Neurologic:  Normal speech and language. No gross focal neurologic deficits are appreciated.  Skin:  Skin is warm, dry and intact. No rash noted. Psychiatric: Mood and affect are normal. Speech and behavior are normal. Patient  exhibits appropriate insight and judgement.   ____________________________________________   LABS (all labs ordered are listed, but only abnormal results are displayed)  Labs Reviewed - No data to display ____________________________________________  EKG   ____________________________________________  RADIOLOGY   No results found.  ____________________________________________    PROCEDURES  Procedure(s) performed:    Procedures    Medications - No data to display   ____________________________________________   INITIAL IMPRESSION / ASSESSMENT AND PLAN / ED COURSE  Pertinent labs & imaging results that were available during my care of the patient were reviewed by me and considered in my medical decision making (see chart for details).  Review of the [place] CSRS was performed in accordance of the [place] prior to dispensing any controlled drugs.   Patient's diagnosis is consistent with carpal tunnel and arthritis. Patient will be discharged home with prescriptions for Mobic. Patient is to follow up with hand orthopedics as directed. Patient is given ED precautions to return to the ED for any worsening or new symptoms.   [name] was evaluated in Emergency Department on 05/18/2020 for the symptoms described in the history of present illness. He was evaluated in the context of the global COVID-19 pandemic, which necessitated consideration that the patient might be at risk for infection with the SARS-CoV-2 virus that causes COVID-19. Institutional protocols and algorithms that pertain to the evaluation of patients at risk for COVID-19 are in a state of rapid change based on information released by regulatory bodies including the CDC and federal and state organizations. These policies and algorithms were followed during the patient's care in the ED.  ____________________________________________  FINAL CLINICAL IMPRESSION(S) / ED DIAGNOSES  Final diagnoses:   Osteoarthritis of both hands, unspecified osteoarthritis type  Bilateral carpal tunnel syndrome      NEW MEDICATIONS STARTED DURING THIS VISIT:  ED Discharge Orders         Ordered    meloxicam (MOBIC) 7.5 MG tablet  Daily     05/18/20 1000              This chart was dictated using voice recognition software/Dragon. Despite best efforts to proofread, errors can occur which can change the meaning. Any change was purely unintentional.    Laban Emperor, [name] 05/18/20 1348    Arta Silence, MD 05/18/20 1615

## 2020-05-18 NOTE — ED Notes (Signed)
Pt discharged home after verbalizing understanding of discharge instructions; nad noted. 

## 2020-07-15 ENCOUNTER — Encounter: Payer: Self-pay | Admitting: Emergency Medicine

## 2020-07-15 ENCOUNTER — Other Ambulatory Visit: Payer: Self-pay

## 2020-07-15 ENCOUNTER — Emergency Department
Admission: EM | Admit: 2020-07-15 | Discharge: 2020-07-16 | Disposition: A | Discharge status: Home / Self Care | Payer: Medicaid Other | Attending: Emergency Medicine | Admitting: Emergency Medicine

## 2020-07-15 ENCOUNTER — Emergency Department: Payer: Medicaid Other

## 2020-07-15 DIAGNOSIS — R0789 Other chest pain: Secondary | ICD-10-CM | POA: Insufficient documentation

## 2020-07-15 LAB — CBC
HCT: 47 % (ref 39.0–52.0)
Hemoglobin: 15 g/dL (ref 13.0–17.0)
MCH: 25.9 pg — ABNORMAL LOW (ref 26.0–34.0)
MCHC: 31.9 g/dL (ref 30.0–36.0)
MCV: 81.2 fL (ref 80.0–100.0)
Platelets: 287 10*3/uL (ref 150–400)
RBC: 5.79 MIL/uL (ref 4.22–5.81)
RDW: 14.5 % (ref 11.5–15.5)
WBC: 7 10*3/uL (ref 4.0–10.5)
nRBC: 0 % (ref 0.0–0.2)

## 2020-07-15 LAB — BASIC METABOLIC PANEL
Anion gap: 5 (ref 5–15)
BUN: 16 mg/dL (ref 6–20)
CO2: 25 mmol/L (ref 22–32)
Calcium: 10.6 mg/dL — ABNORMAL HIGH (ref 8.9–10.3)
Chloride: 110 mmol/L (ref 98–111)
Creatinine, Ser: 1.02 mg/dL (ref 0.61–1.24)
GFR calc Af Amer: >60 mL/min (ref >60)
GFR calc non Af Amer: >60 mL/min (ref >60)
Glucose, Bld: 113 mg/dL — ABNORMAL HIGH (ref 70–99)
Potassium: 3.9 mmol/L (ref 3.5–5.1)
Sodium: 140 mmol/L (ref 135–145)

## 2020-07-15 LAB — TROPONIN I (HIGH SENSITIVITY)
Troponin I (High Sensitivity): 4 ng/L (ref <18)
Troponin I (High Sensitivity): 7 ng/L (ref <18)

## 2020-07-15 NOTE — ED Triage Notes (Signed)
Pt arrived via POV with c/o chest pain that started about 45 mins PTA. Pt states the pain started after fixing a tire.  Pt states if he takes in a deep breath the pain is there.  Pt describes the pain as sharp and lasts only a few seconds.

## 2020-07-16 LAB — FIBRIN DERIVATIVES D-DIMER (ARMC ONLY): Fibrin derivatives D-dimer (ARMC): 220.64 ng/mL (FEU) (ref 0.00–499.00)

## 2020-07-16 MED ORDER — ASPIRIN 81 MG PO CHEW
324.0000 mg | CHEWABLE_TABLET | Freq: Once | ORAL | Status: AC
  Administered 2020-07-16: 324 mg via ORAL
  Filled 2020-07-16: qty 4

## 2020-07-16 NOTE — ED Provider Notes (Signed)
[facility] Emergency Department Provider Note ____________________________________________   First MD Initiated Contact with Patient 07/15/20 2351     (approximate)  I have reviewed the triage vital signs and the nursing notes.   HISTORY  Chief Complaint Chest Pain   HPI A 49 year old patient presents for evaluation of chest pain. Initial onset of pain was less than one hour ago. The patient's chest pain is well-localized, is sharp and is not worse with exertion. The patient's chest pain is middle- or left-sided, is not described as heaviness/pressure/tightness and does not radiate to the arms/jaw/neck. The patient does not complain of nausea and denies diaphoresis. The patient has no history of stroke, has no history of peripheral artery disease, has not smoked in the past 90 days, denies any history of treated diabetes, has no relevant family history of coronary artery disease (first degree relative at less than age 63), is not hypertensive, has no history of hypercholesterolemia and does not have an elevated BMI (>=30).   Patient reports sharp pain in his left upper chest while putting tires on his daughter's car.  Reports the pain lasted only a few seconds was very sharp.  It then seemed like he was having little episodes of twinges of pain thereafter prompting him to call 911.  Denies any other pain.  No other illness.  No shortness of breath.  No history of blood clots.  No recent long trips or travel.  No illnesses fevers or chills.  Ports pain is almost completely subsided at this point and sometimes is just an achiness pointing directly over his left upper pectoralis region.  No radiation.  No pain that moved to the back or other area of  Past Medical History:  Diagnosis Date  . Carpal tunnel syndrome   . Insomnia 08/05/2016  . Osteoarthritis of both hands 07/14/2017    Patient Active Problem List   Diagnosis Date Noted  . Prediabetes 10/23/2017  .  Hypercalcemia 07/15/2017  . Osteoarthritis of both hands 07/14/2017  . Carpal tunnel syndrome 07/14/2017  . Insomnia 08/05/2016    Past Surgical History:  Procedure Laterality Date  . CHOLECYSTECTOMY      Prior to Admission medications   Medication Sig Start Date End Date Taking? Authorizing Provider  meloxicam (MOBIC) 7.5 MG tablet Take 1 tablet (7.5 mg total) by mouth daily. 05/18/20   Enid Derry, [name]  methocarbamol (ROBAXIN) 500 MG tablet Take 1 tablet (500 mg total) by mouth 2 (two) times daily as needed for muscle spasms. Patient not taking: Reported on 08/24/2019 09/12/17   Ward, Chase Picket, [name]  predniSONE (STERAPRED UNI-PAK 21 TAB) 10 MG (21) TBPK tablet Take by mouth daily. Take as directed. Patient not taking: Reported on 08/24/2019 02/12/19   Mardella Layman, MD    Allergies Patient has no known allergies.  History reviewed. No pertinent family history.  Social History Social History   Tobacco Use  . Smoking status: Never Smoker  . Smokeless tobacco: Never Used  Vaping Use  . Vaping Use: Never used  Substance Use Topics  . Alcohol use: No  . Drug use: No    Review of Systems Constitutional: No fever/chills Eyes: No visual changes. ENT: No sore throat. Cardiovascular: See HPI.  Denies any sweating.  No nausea no shortness of breath.  No feeling of pressure in the chest. Respiratory: Denies shortness of breath. Gastrointestinal: No abdominal pain.   Genitourinary: Negative for dysuria. Musculoskeletal: Negative for back pain. Skin: Negative for rash. Neurological:  Negative for headaches, areas of focal weakness or numbness.    ____________________________________________   PHYSICAL EXAM:  VITAL SIGNS: ED Triage Vitals  Enc Vitals Group     BP 07/15/20 2057 (!) 138/94     Pulse Rate 07/15/20 2057 89     Resp 07/15/20 2057 20     Temp 07/15/20 2057 97.8 F (36.6 C)     Temp Source 07/15/20 2057 Oral     SpO2 07/15/20 2057 99 %     Weight  07/15/20 2057 191 lb (86.6 kg)     Height 07/15/20 2057 5' 8.75" (1.746 m)     Head Circumference --      Peak Flow --      Pain Score 07/15/20 2056 2     Pain Loc --      Pain Edu? --      Excl. in GC? --     Constitutional: Alert and oriented. Well appearing and in no acute distress. Eyes: Conjunctivae are normal. Head: Atraumatic. Nose: No congestion/rhinnorhea. Mouth/Throat: Mucous membranes are moist. Neck: No stridor.  Cardiovascular: Normal rate, regular rhythm. Grossly normal heart sounds.  Good peripheral circulation. Respiratory: Normal respiratory effort.  No retractions. Lungs CTAB. Gastrointestinal: Soft and nontender. No distention. Musculoskeletal: No lower extremity tenderness nor edema.  Some slight reproducibility of pain over the left upper pectoralis region.  Full range of motion upper extremities however without pain.  Strong radial pulses bilateral. Neurologic:  Normal speech and language. No gross focal neurologic deficits are appreciated.  Skin:  Skin is warm, dry and intact. No rash noted. Psychiatric: Mood and affect are normal. Speech and behavior are normal.  ____________________________________________   LABS (all labs ordered are listed, but only abnormal results are displayed)  Labs Reviewed  BASIC METABOLIC PANEL - Abnormal; Notable for the following components:      Result Value   Glucose, Bld 113 (*)    Calcium 10.6 (*)    All other components within normal limits  CBC - Abnormal; Notable for the following components:   MCH 25.9 (*)    All other components within normal limits  FIBRIN DERIVATIVES D-DIMER ([facility] ONLY)  TROPONIN I (HIGH SENSITIVITY)  TROPONIN I (HIGH SENSITIVITY)   ____________________________________________  EKG  Reviewed inter by me at 2100 Heart rate 50 QRS 100 QTc 400 Sinus bradycardia, no evidence of acute ischemia.  Probable left ventricular  hypertrophy. ____________________________________________  RADIOLOGY  DG Chest 2 View  Result Date: 07/15/2020 CLINICAL DATA:  Chest pain which began 45 minutes prior to admission EXAM: CHEST - 2 VIEW COMPARISON:  None. FINDINGS: No consolidation, features of edema, pneumothorax, or effusion. Pulmonary vascularity is normally distributed. The cardiomediastinal contours are unremarkable. No acute osseous or soft tissue abnormality. Cholecystectomy clips in the right upper quadrant. IMPRESSION: No acute cardiopulmonary abnormality. Electronically Signed   By: Kreg Shropshire M.D.   On: 07/15/2020 21:55     ____________________________________________   PROCEDURES  Procedure(s) performed: None  Procedures  Critical Care performed: No  ____________________________________________   INITIAL IMPRESSION / ASSESSMENT AND PLAN / ED COURSE  Pertinent labs & imaging results that were available during my care of the patient were reviewed by me and considered in my medical decision making (see chart for details).   Differential diagnosis includes, but is not limited to, ACS, aortic dissection, pulmonary embolism, cardiac tamponade, pneumothorax, pneumonia, pericarditis, myocarditis, GI-related causes including esophagitis/gastritis, and musculoskeletal chest wall pain.    Chest pain atypical in nature.  Seems  reproducible sharp and associated with lifting a tire.  I suspect likely musculoskeletal chest wall pain possibly pectoralis.  No evidence of tear or rupture.  Patient resting quite comfortably with reassuring EKG, hear score HEAR Score: 2   No noted high risk factors for pulmonary embolism or DVT.  D-dimer less than 500.  Return precautions and treatment recommendations and follow-up discussed with the patient who is agreeable with the plan.  I recommended patient set up follow-up with cardiology, his symptoms very atypical, but EKG does demonstrate some findings that could be indicative of  underlying left ventricular hypertrophy which I recommended he follow-up with cardiology on this as well as his chest pain.  Return precautions and treatment recommendations and follow-up discussed with the patient who is agreeable with the plan.       ____________________________________________   FINAL CLINICAL IMPRESSION(S) / ED DIAGNOSES  Final diagnoses:  Atypical chest pain        Note:  This document was prepared using Dragon voice recognition software and may include unintentional dictation errors       Sharyn Creamer, MD 07/16/20 [phone]

## 2020-07-16 NOTE — ED Notes (Addendum)
Pt lying in stretcher alert and oriented. Pt eating sandwich pt had brought in.  NAD noted, no c/o pain from pt. Family sitting bedside.  Warm blankets provided.

## 2020-07-16 NOTE — Discharge Instructions (Signed)

## 2020-08-31 ENCOUNTER — Other Ambulatory Visit: Payer: Self-pay

## 2020-08-31 ENCOUNTER — Ambulatory Visit (INDEPENDENT_AMBULATORY_CARE_PROVIDER_SITE_OTHER): Payer: Self-pay | Admitting: Cardiology

## 2020-08-31 ENCOUNTER — Encounter: Payer: Self-pay | Admitting: Cardiology

## 2020-08-31 VITALS — BP 132/70 | HR 61 | Ht 68.75 in | Wt 185.0 lb

## 2020-08-31 DIAGNOSIS — R079 Chest pain, unspecified: Secondary | ICD-10-CM

## 2020-08-31 NOTE — Progress Notes (Signed)
Cardiology Office Note:    Date:  08/31/2020   ID:  Roberto Moore, DOB [birth year], MRN [id number]  PCP:  Patient, No Pcp Per  [facility] Cardiologist:  Debbe Odea, MD  [facility] Electrophysiologist:  None   Referring MD: No ref. provider found   Chief Complaint  Patient presents with  . New Patient (Initial Visit)    Establish care with provider after ED visit for chest pains. Medications verbally reviewed with patient.     History of Present Illness:    Roberto Moore is a 48 y.o. male with no significant past medical history who presents due to chest pain.  Patient states having an episode of chest pain a month ago while changing his daughter's tire.  Describes chest pain as sharp, located in the left side of his chest, rates it as 10 out of 10 in severity.  Pain lasted about 10 seconds will resolve and then return.  He has family who works in the [employer] and suggested he come to the emergency room.  He presented in the ED, was evaluated with an EKG, troponins, x-ray which were all normal.  His symptoms were deemed atypical and likely musculoskeletal in origin.  He denies having any further episodes since.  Denies smoking, denies any personal or family history of heart disease.  He feels fine, able to perform his activities of daily living, work in his ER, climb stairs without any chest pain or shortness of breath.  Past Medical History:  Diagnosis Date  . Carpal tunnel syndrome   . Insomnia 08/05/2016  . Osteoarthritis of both hands 07/14/2017    Past Surgical History:  Procedure Laterality Date  . CHOLECYSTECTOMY      Current Medications: No outpatient medications have been marked as taking for the 08/31/20 encounter (Office Visit) with Debbe Odea, MD.     Allergies:   Patient has no known allergies.   Social History   Socioeconomic History  . Marital status: Single    Spouse name: Not on file  . Number of children: Not on file  . Years of  education: Not on file  . Highest education level: Not on file  Occupational History  . Not on file  Tobacco Use  . Smoking status: Never Smoker  . Smokeless tobacco: Never Used  Vaping Use  . Vaping Use: Never used  Substance and Sexual Activity  . Alcohol use: No  . Drug use: No  . Sexual activity: Not on file  Other Topics Concern  . Not on file  Social History Narrative  . Not on file   Social Determinants of Health   Financial Resource Strain:   . Difficulty of Paying Living Expenses: Not on file  Food Insecurity:   . Worried About Programme researcher, broadcasting/film/video in the Last Year: Not on file  . Ran Out of Food in the Last Year: Not on file  Transportation Needs:   . Lack of Transportation (Medical): Not on file  . Lack of Transportation (Non-Medical): Not on file  Physical Activity:   . Days of Exercise per Week: Not on file  . Minutes of Exercise per Session: Not on file  Stress:   . Feeling of Stress : Not on file  Social Connections:   . Frequency of Communication with Friends and Family: Not on file  . Frequency of Social Gatherings with Friends and Family: Not on file  . Attends Religious Services: Not on file  . Active Member of Clubs  or Organizations: Not on file  . Attends Banker Meetings: Not on file  . Marital Status: Not on file     Family History: The patient's family history is not on file.  ROS:   Please see the history of present illness.     All other systems reviewed and are negative.  EKGs/Labs/Other Studies Reviewed:    The following studies were reviewed today:   EKG:  EKG is  ordered today.  The ekg ordered today demonstrates sinus rhythm, normal ECG.  Recent Labs: 07/15/2020: BUN 16; Creatinine, Ser 1.02; Hemoglobin 15.0; Platelets 287; Potassium 3.9; Sodium 140  Recent Lipid Panel    Component Value Date/Time   CHOL 147 07/14/2017 1344   TRIG 55 07/14/2017 1344   HDL 54 07/14/2017 1344   CHOLHDL 2.7 07/14/2017 1344    CHOLHDL 3.5 11/13/2016 0951   VLDL 15 11/13/2016 0951   LDLCALC 82 07/14/2017 1344    Physical Exam:    VS:  BP 132/70 (BP Location: Right Arm, Patient Position: Sitting, Cuff Size: Normal)   Pulse 61   Ht 5' 8.75" (1.746 m)   Wt 185 lb (83.9 kg)   SpO2 98%   BMI 27.52 kg/m     Wt Readings from Last 3 Encounters:  08/31/20 185 lb (83.9 kg)  07/15/20 191 lb (86.6 kg)  05/18/20 185 lb (83.9 kg)     GEN:  Well nourished, well developed in no acute distress HEENT: Normal NECK: No JVD; No carotid bruits LYMPHATICS: No lymphadenopathy CARDIAC: RRR, no murmurs, rubs, gallops RESPIRATORY:  Clear to auscultation without rales, wheezing or rhonchi  ABDOMEN: Soft, non-tender, non-distended MUSCULOSKELETAL:  No edema; No deformity  SKIN: Warm and dry NEUROLOGIC:  Alert and oriented x 3 PSYCHIATRIC:  Normal affect   ASSESSMENT:    1. Chest pain of uncertain etiology    PLAN:    In order of problems listed above:  1. Patient with symptoms of chest pain, very atypical in nature likely musculoskeletal in origin as it occurred after changing his daughter's tire.  Currently denies any symptoms, hasn't had any episodes since.  He has no cardiac risk factors.  Patient educated on cardiac causes and symptoms of chest discomfort.  If this were to occur or he has symptoms consistent with cardiac etiology, we will consider additional testing.  Follow-up as needed  Total encounter time 46 minutes  Greater than 50% was spent in counseling and coordination of care with the patient   Medication Adjustments/Labs and Tests Ordered: Current medicines are reviewed at length with the patient today.  Concerns regarding medicines are outlined above.  Orders Placed This Encounter  Procedures  . EKG 12-Lead   No orders of the defined types were placed in this encounter.   Patient Instructions  Medication Instructions:  Your physician recommends that you continue on your current medications as  directed. Please refer to the Current Medication list given to you today.  *If you need a refill on your cardiac medications before your next appointment, please call your pharmacy*   Lab Work: None Ordered If you have labs (blood work) drawn today and your tests are completely normal, you will receive your results only by: Marland Kitchen MyChart Message (if you have MyChart) OR . A paper copy in the mail If you have any lab test that is abnormal or we need to change your treatment, we will call you to review the results.   Testing/Procedures: None Ordered   Follow-Up: At [facility], you and your health needs are our priority.  As part of our continuing mission to provide you with exceptional heart care, we have created designated Provider Care Teams.  These Care Teams include your primary Cardiologist (physician) and Advanced Practice Providers (APPs -  Physician Assistants and Nurse Practitioners) who all work together to provide you with the care you need, when you need it.  We recommend signing up for the patient portal called "MyChart".  Sign up information is provided on this After Visit Summary.  MyChart is used to connect with patients for Virtual Visits (Telemedicine).  Patients are able to view lab/test results, encounter notes, upcoming appointments, etc.  Non-urgent messages can be sent to your provider as well.   To learn more about what you can do with MyChart, go to [email or web address].    Your next appointment:   Follow up as needed   The format for your next appointment:   In Person  Provider:   Debbe Odea, MD   Other Instructions      Signed, Debbe Odea, MD  08/31/2020 4:45 PM    [facility]

## 2020-08-31 NOTE — Patient Instructions (Signed)

## 2021-03-20 ENCOUNTER — Ambulatory Visit: Payer: Medicaid Other | Admitting: Gerontology

## 2021-03-21 ENCOUNTER — Ambulatory Visit: Payer: Medicaid Other | Admitting: Gerontology

## 2021-03-21 ENCOUNTER — Other Ambulatory Visit: Payer: Self-pay

## 2021-03-21 VITALS — BP 134/85 | HR 65 | Temp 98.0°F | Ht 68.0 in | Wt 186.9 lb

## 2021-03-21 DIAGNOSIS — Z7689 Persons encountering health services in other specified circumstances: Secondary | ICD-10-CM

## 2021-03-21 DIAGNOSIS — Z1211 Encounter for screening for malignant neoplasm of colon: Secondary | ICD-10-CM

## 2021-03-21 NOTE — Patient Instructions (Signed)

## 2021-03-21 NOTE — Progress Notes (Signed)
  [facility] [place]   Progress Note: General Provider: Wolfgang Phoenix, NP  SUBJECTIVE:   Roberto Moore is a 50 y.o. male who  has a past medical history of Allergy, Carpal tunnel syndrome, Insomnia (08/05/2016), and Osteoarthritis of both hands (07/14/2017).. Patient presents today for Annual Exam (Last saw a doctor 2 years ago.) He reports that he doesn't eat junk food and goes to the gym 3-4 x a week. He denies taking regular medications and states that he is up to date with his COVID-19 vaccination, he brought vaccination card with him..  Overall, he states that he's doing well and offers no further complaint Review of Systems  Constitutional: Negative.   HENT: Negative.   Eyes: Negative.   Respiratory: Negative.   Cardiovascular: Negative.   Gastrointestinal: Negative.   Genitourinary: Negative.   Musculoskeletal: Negative.   Skin: Negative.   Neurological: Negative.   Psychiatric/Behavioral: Negative.      OBJECTIVE: BP 134/85 (BP Location: Left Arm, Patient Position: Sitting, Cuff Size: Large)   Pulse 65   Temp 98 F (36.7 C)   Ht $R'5\' 8"'Pf$  (1.727 m)   Wt 186 lb 15.2 oz (84.8 kg)   SpO2 98%   BMI 28.43 kg/m   Wt Readings from Last 3 Encounters:  03/21/21 186 lb 15.2 oz (84.8 kg)  08/31/20 185 lb (83.9 kg)  07/15/20 191 lb (86.6 kg)     Physical Exam Vitals reviewed.  Constitutional:      Appearance: Normal appearance.  HENT:     Head: Normocephalic.     Right Ear: Tympanic membrane normal.     Left Ear: Tympanic membrane normal.     Nose: Nose normal.     Mouth/Throat:     Mouth: Mucous membranes are moist.  Eyes:     Pupils: Pupils are equal, round, and reactive to light.  Cardiovascular:     Rate and Rhythm: Regular rhythm.     Pulses: Normal pulses.     Heart sounds: Normal heart sounds.  Pulmonary:     Effort: Pulmonary effort is normal.     Breath sounds: Normal breath sounds.  Abdominal:     General: Abdomen is flat. Bowel sounds are  normal.     Palpations: Abdomen is soft.  Musculoskeletal:        General: Normal range of motion.     Cervical back: Normal range of motion and neck supple.  Skin:    General: Skin is warm and dry.     Capillary Refill: Capillary refill takes less than 2 seconds.  Neurological:     General: No focal deficit present.     Mental Status: He is alert.  Psychiatric:        Mood and Affect: Mood normal.        Behavior: Behavior normal.     ASSESSMENT/PLAN:   1. Encounter to establish care - CBC - Comp Met (CMET) - HgB A1c - TSH - Lipid Panel With LDL/HDL Ratio - Vitamin D (25 hydroxy) - Ambulatory referral to Gastroenterology for colon cancer screening   Follow-up in 4 weeks for lab review.   The patient was given clear instructions to go to ER or return to medical center if symptoms do not improve, worsen or new problems develop. The patient verbalized understanding and agreed with plan of care.  Roberto Moore, [place]

## 2021-03-22 LAB — COMPREHENSIVE METABOLIC PANEL
ALT: 45 IU/L — ABNORMAL HIGH (ref 0–44)
AST: 27 IU/L (ref 0–40)
Albumin/Globulin Ratio: 1.4 (ref 1.2–2.2)
Albumin: 4.3 g/dL (ref 3.8–4.9)
Alkaline Phosphatase: 128 IU/L — ABNORMAL HIGH (ref 44–121)
BUN/Creatinine Ratio: 16 (ref 9–20)
BUN: 16 mg/dL (ref 6–24)
Bilirubin Total: 1.4 mg/dL — ABNORMAL HIGH (ref 0.0–1.2)
CO2: 22 mmol/L (ref 20–29)
Calcium: 11.4 mg/dL — ABNORMAL HIGH (ref 8.7–10.2)
Chloride: 108 mmol/L — ABNORMAL HIGH (ref 96–106)
Creatinine, Ser: 0.99 mg/dL (ref 0.76–1.27)
Globulin, Total: 3 g/dL (ref 1.5–4.5)
Glucose: 101 mg/dL — ABNORMAL HIGH (ref 65–99)
Potassium: 4.5 mmol/L (ref 3.5–5.2)
Sodium: 141 mmol/L (ref 134–144)
Total Protein: 7.3 g/dL (ref 6.0–8.5)
eGFR: 92 mL/min/{1.73_m2} (ref >59)

## 2021-03-22 LAB — CBC
Hematocrit: 46.1 % (ref 37.5–51.0)
Hemoglobin: 15.2 g/dL (ref 13.0–17.7)
MCH: 26.7 pg (ref 26.6–33.0)
MCHC: 33 g/dL (ref 31.5–35.7)
MCV: 81 fL (ref 79–97)
Platelets: 291 10*3/uL (ref 150–450)
RBC: 5.7 x10E6/uL (ref 4.14–5.80)
RDW: 12.7 % (ref 11.6–15.4)
WBC: 6.7 10*3/uL (ref 3.4–10.8)

## 2021-03-22 LAB — LIPID PANEL WITH LDL/HDL RATIO
Cholesterol, Total: 157 mg/dL (ref 100–199)
HDL: 50 mg/dL (ref >39)
LDL Chol Calc (NIH): 93 mg/dL (ref 0–99)
LDL/HDL Ratio: 1.9 ratio (ref 0.0–3.6)
Triglycerides: 75 mg/dL (ref 0–149)
VLDL Cholesterol Cal: 14 mg/dL (ref 5–40)

## 2021-03-22 LAB — TSH: TSH: 1.85 u[IU]/mL (ref 0.450–4.500)

## 2021-03-22 LAB — VITAMIN D 25 HYDROXY (VIT D DEFICIENCY, FRACTURES): Vit D, 25-Hydroxy: 7 ng/mL — ABNORMAL LOW (ref 30.0–100.0)

## 2021-03-22 LAB — HEMOGLOBIN A1C
Est. average glucose Bld gHb Est-mCnc: 140 mg/dL
Hgb A1c MFr Bld: 6.5 % — ABNORMAL HIGH (ref 4.8–5.6)

## 2021-04-11 ENCOUNTER — Encounter: Payer: Self-pay | Admitting: Gastroenterology

## 2021-04-18 ENCOUNTER — Other Ambulatory Visit: Payer: Self-pay

## 2021-04-18 ENCOUNTER — Ambulatory Visit: Payer: Medicaid Other | Admitting: Gerontology

## 2021-04-18 VITALS — BP 131/86 | HR 62 | Resp 20 | Ht 68.0 in | Wt 188.7 lb

## 2021-04-18 DIAGNOSIS — R7401 Elevation of levels of liver transaminase levels: Secondary | ICD-10-CM

## 2021-04-18 DIAGNOSIS — E119 Type 2 diabetes mellitus without complications: Secondary | ICD-10-CM

## 2021-04-18 DIAGNOSIS — E559 Vitamin D deficiency, unspecified: Secondary | ICD-10-CM

## 2021-04-18 DIAGNOSIS — S46812A Strain of other muscles, fascia and tendons at shoulder and upper arm level, left arm, initial encounter: Secondary | ICD-10-CM

## 2021-04-18 MED ORDER — METFORMIN HCL 1000 MG PO TABS: 500.0000 mg | ORAL_TABLET | Freq: Every day | ORAL | 0 refills | Status: DC

## 2021-04-18 MED ORDER — VITAMIN D (ERGOCALCIFEROL) 1.25 MG (50000 UNIT) PO CAPS: 50000.0000 [IU] | ORAL_CAPSULE | ORAL | 0 refills | Status: AC

## 2021-04-18 NOTE — Progress Notes (Signed)
[facility] [place]   Progress Note: General Provider: Regino Bellow, NP  SUBJECTIVE:   Roberto Moore is a 50 y.o. male who  has a past medical history of Allergy, Carpal tunnel syndrome, Insomnia (08/05/2016), and Osteoarthritis of both hands (07/14/2017).. Patient presents today for lab review and left arm pain.    Labs done on 03/21/21 Vitamin D was 7 ng/ml he denies fatigue, mood changes, muscle weakness or cramps. Calcium 11.4 mg/dl, Alkaline phosphatase was 128, ALT 45 IU/L and HgbA1c 6.5%. He states that he drinks 8 oz orange juice, 48 oz Bodyarmor sports drink, and 16oz Gatorade daily. He denies hyperglycemia symptoms. He is ready to make healthy lifestyle changes to manage diabetes. He complained of the left deltoid aching intermittent pain 6/10 that radiates down to the wrist after lifting a mower engine at work a week ago. He also has occasional paresthesia to the left fingertips. He reports lifting heavy objects at work exacerbates his pain. Heat packs and Aleve lotion reduce pain to 2/10. He denies smoking and drinks a glass of wine twice a week. He endorses left eye blurry vision, and he has scheduled an eye exam at [facility] next week.  He had a primary series of covid 19 vaccines and the first booster dose. He has no other complaints. Overall, he states that he's doing well and offers no further complaint. Review of Systems  Constitutional: Negative.   Eyes: Blurred vision: left eye.  Respiratory: Negative.   Cardiovascular: Negative.   Gastrointestinal: Negative.   Genitourinary: Negative.   Musculoskeletal:       Left arm down to wrist  Skin: Negative.   Neurological: Negative.   Endo/Heme/Allergies: Negative.   Psychiatric/Behavioral: Negative.      OBJECTIVE: BP 131/86   Pulse 62   Resp 20   Ht 5\' 8"  (1.727 m)   Wt 188 lb 11.2 oz (85.6 kg)   SpO2 97%   BMI 28.69 kg/m   Wt Readings from Last 3 Encounters:  04/18/21 188 lb 11.2 oz (85.6 kg)  03/21/21 186  lb 15.2 oz (84.8 kg)  08/31/20 185 lb (83.9 kg)     Physical Exam Vitals reviewed.  Constitutional:      Appearance: Normal appearance.  HENT:     Head: Normocephalic.  Cardiovascular:     Rate and Rhythm: Normal rate and regular rhythm.     Pulses: Normal pulses.     Heart sounds: Normal heart sounds.  Pulmonary:     Effort: Pulmonary effort is normal.     Breath sounds: Normal breath sounds.  Abdominal:     General: Abdomen is flat. Bowel sounds are normal.     Palpations: Abdomen is soft.  Musculoskeletal:        General: Tenderness (left deltoid ) present.     Cervical back: Normal range of motion.  Skin:    General: Skin is warm and dry.  Neurological:     General: No focal deficit present.     Mental Status: He is alert and oriented to person, place, and time.  Psychiatric:        Mood and Affect: Mood normal.        Behavior: Behavior normal.        Thought Content: Thought content normal.     ASSESSMENT/PLAN:  1. Diabetes mellitus without complication (HCC) - metFORMIN (GLUCOPHAGE) 1000 MG tablet; Take 0.5 tablets (500 mg total) by mouth daily with breakfast.  Dispense: 90 tablet; Refill: 0 -Advised  to contact the clinic if having gastrointestinal problems when taking Metformin. - HgB A1c; Future  2. Vitamin D deficiency - Vitamin D, Ergocalciferol, (DRISDOL) 1.25 MG (50000 UNIT) CAPS capsule; Take 1 capsule (50,000 Units total) by mouth every 7 (seven) days for 8 doses.  Dispense: 8 capsule; Refill: 0 -Advised to get exposed to the sun bout 15-20 minutes three times a week. -Advised o take vitamin D supplements with a meal to enhance absorption 3. Elevated ALT measurement -Heatits, Acute future 4. Strain of left deltoid muscle, initial encounter -Advised to rest and apply ice and heat.  Advised to stretch daily to improve range of motion, and flexibility, always warm-up before any exercise or lifting heavy objects at work. -Continue topical pain-relieving  lotions. Contact the clinic if the pain does not improve or gets worse   Return in about 8 weeks (around 06/13/2021), or if symptoms worsen or fail to improve, for diabetes and vitamin d deficiency.    The patient was given clear instructions to go to ER or return to medical center if symptoms do not improve, worsen or new problems develop. The patient verbalized understanding and agreed with plan of care.  Julisa Flippo, AGNP [facility]   lab follow up.

## 2021-04-18 NOTE — Patient Instructions (Signed)
[email or web address]">  DASH Eating Plan DASH stands for Dietary Approaches to Stop Hypertension. The DASH eating plan is a healthy eating plan that has been shown to:  Reduce high blood pressure (hypertension).  Reduce your risk for type 2 diabetes, heart disease, and stroke.  Help with weight loss. What are tips for following this plan? Reading food labels  Check food labels for the amount of salt (sodium) per serving. Choose foods with less than 5 percent of the Daily Value of sodium. Generally, foods with less than 300 milligrams (mg) of sodium per serving fit into this eating plan.  To find whole grains, look for the word "whole" as the first word in the ingredient list. Shopping  Buy products labeled as "low-sodium" or "no salt added."  Buy fresh foods. Avoid canned foods and pre-made or frozen meals. Cooking  Avoid adding salt when cooking. Use salt-free seasonings or herbs instead of table salt or sea salt. Check with your health care provider or pharmacist before using salt substitutes.  Do not fry foods. Cook foods using healthy methods such as baking, boiling, grilling, roasting, and broiling instead.  Cook with heart-healthy oils, such as olive, canola, avocado, soybean, or sunflower oil. Meal planning  Eat a balanced diet that includes: ? 4 or more servings of fruits and 4 or more servings of vegetables each day. Try to fill one-half of your plate with fruits and vegetables. ? 6-8 servings of whole grains each day. ? Less than 6 oz (170 g) of lean meat, poultry, or fish each day. A 3-oz (85-g) serving of meat is about the same size as a deck of cards. One egg equals 1 oz (28 g). ? 2-3 servings of low-fat dairy each day. One serving is 1 cup (237 mL). ? 1 serving of nuts, seeds, or beans 5 times each week. ? 2-3 servings of heart-healthy fats. Healthy fats called omega-3 fatty acids are found in foods such as walnuts,  flaxseeds, fortified milks, and eggs. These fats are also found in cold-water fish, such as sardines, salmon, and mackerel.  Limit how much you eat of: ? Canned or prepackaged foods. ? Food that is high in trans fat, such as some fried foods. ? Food that is high in saturated fat, such as fatty meat. ? Desserts and other sweets, sugary drinks, and other foods with added sugar. ? Full-fat dairy products.  Do not salt foods before eating.  Do not eat more than 4 egg yolks a week.  Try to eat at least 2 vegetarian meals a week.  Eat more home-cooked food and less restaurant, buffet, and fast food.   Lifestyle  When eating at a restaurant, ask that your food be prepared with less salt or no salt, if possible.  If you drink alcohol: ? Limit how much you use to:  0-1 drink a day for women who are not pregnant.  0-2 drinks a day for men. ? Be aware of how much alcohol is in your drink. In the [place] one drink equals one 12 oz bottle of beer (355 mL), one 5 oz glass of wine (148 mL), or one 1 oz glass of hard liquor (44 mL). General information  Avoid eating more than 2,300 mg of salt a day. If you have hypertension, you may need to reduce your sodium intake to 1,500 mg a day.  Work with your health care provider to maintain a healthy body weight or to lose weight. Ask what an ideal weight is for  you.  Get at least 30 minutes of exercise that causes your heart to beat faster (aerobic exercise) most days of the week. Activities may include walking, swimming, or biking.  Work with your health care provider or dietitian to adjust your eating plan to your individual calorie needs. What foods should I eat? Fruits All fresh, dried, or frozen fruit. Canned fruit in natural juice (without added sugar). Vegetables Fresh or frozen vegetables (raw, steamed, roasted, or grilled). Low-sodium or reduced-sodium tomato and vegetable juice. Low-sodium or reduced-sodium tomato sauce and tomato paste.  Low-sodium or reduced-sodium canned vegetables. Grains Whole-grain or whole-wheat bread. Whole-grain or whole-wheat pasta. Brown rice. [place]. Bulgur. Whole-grain and low-sodium cereals. Pita bread. Low-fat, low-sodium crackers. Whole-wheat flour tortillas. Meats and other proteins Skinless chicken or [place]. Ground chicken or [place]. Pork with fat trimmed off. Fish and seafood. Egg whites. Dried beans, peas, or lentils. Unsalted nuts, nut butters, and seeds. Unsalted canned beans. Lean cuts of beef with fat trimmed off. Low-sodium, lean precooked or cured meat, such as sausages or meat loaves. Dairy Low-fat (1%) or fat-free (skim) milk. Reduced-fat, low-fat, or fat-free cheeses. Nonfat, low-sodium ricotta or cottage cheese. Low-fat or nonfat yogurt. Low-fat, low-sodium cheese. Fats and oils Soft margarine without trans fats. Vegetable oil. Reduced-fat, low-fat, or light mayonnaise and salad dressings (reduced-sodium). Canola, safflower, olive, avocado, soybean, and sunflower oils. Avocado. Seasonings and condiments Herbs. Spices. Seasoning mixes without salt. Other foods Unsalted popcorn and pretzels. Fat-free sweets. The items listed above may not be a complete list of foods and beverages you can eat. Contact a dietitian for more information. What foods should I avoid? Fruits Canned fruit in a light or heavy syrup. Fried fruit. Fruit in cream or butter sauce. Vegetables Creamed or fried vegetables. Vegetables in a cheese sauce. Regular canned vegetables (not low-sodium or reduced-sodium). Regular canned tomato sauce and paste (not low-sodium or reduced-sodium). Regular tomato and vegetable juice (not low-sodium or reduced-sodium). Angie Fava. Olives. Grains Baked goods made with fat, such as croissants, muffins, or some breads. Dry pasta or rice meal packs. Meats and other proteins Fatty cuts of meat. Ribs. Fried meat. Berniece Salines. Bologna, salami, and other precooked or cured meats, such as  sausages or meat loaves. Fat from the back of a pig (fatback). Bratwurst. Salted nuts and seeds. Canned beans with added salt. Canned or smoked fish. Whole eggs or egg yolks. Chicken or [place] with skin. Dairy Whole or 2% milk, cream, and half-and-half. Whole or full-fat cream cheese. Whole-fat or sweetened yogurt. Full-fat cheese. Nondairy creamers. Whipped toppings. Processed cheese and cheese spreads. Fats and oils Butter. Stick margarine. Lard. Shortening. Ghee. Bacon fat. Tropical oils, such as coconut, palm kernel, or palm oil. Seasonings and condiments Onion salt, garlic salt, seasoned salt, table salt, and sea salt. Worcestershire sauce. Tartar sauce. Barbecue sauce. Teriyaki sauce. Soy sauce, including reduced-sodium. Steak sauce. Canned and packaged gravies. Fish sauce. Oyster sauce. Cocktail sauce. Store-bought horseradish. Ketchup. Mustard. Meat flavorings and tenderizers. Bouillon cubes. Hot sauces. Pre-made or packaged marinades. Pre-made or packaged taco seasonings. Relishes. Regular salad dressings. Other foods Salted popcorn and pretzels. The items listed above may not be a complete list of foods and beverages you should avoid. Contact a dietitian for more information. Where to find more information  National Heart, Lung, and Blood Institute: https://wilson-eaton.com/  American Heart Association: www.heart.org  Academy of Nutrition and Dietetics: www.eatright.[place]: www.kidney.org Summary  The DASH eating plan is a healthy eating plan that has been shown to  reduce high blood pressure (hypertension). It may also reduce your risk for type 2 diabetes, heart disease, and stroke.  When on the DASH eating plan, aim to eat more fresh fruits and vegetables, whole grains, lean proteins, low-fat dairy, and heart-healthy fats.  With the DASH eating plan, you should limit salt (sodium) intake to 2,300 mg a day. If you have hypertension, you may need to reduce your  sodium intake to 1,500 mg a day.  Work with your health care provider or dietitian to adjust your eating plan to your individual calorie needs. This information is not intended to replace advice given to you by your health care provider. Make sure you discuss any questions you have with your health care provider. Document Revised: 11/04/2019 Document Reviewed: 11/04/2019 Elsevier Patient Education  2021 [place]. Vitamin D Deficiency Vitamin D deficiency is when your body does not have enough vitamin D. Vitamin D is important to your body because:  It helps your body use other minerals.  It helps to keep your bones strong and healthy.  It may help to prevent some diseases.  It helps your heart and other muscles work well. Not getting enough vitamin D can make your bones soft. It can also cause other health problems. What are the causes? This condition may be caused by:  Not eating enough foods that contain vitamin D.  Not getting enough sun.  Having diseases that make it hard for your body to absorb vitamin D.  Having a surgery in which a part of the stomach or a part of the small intestine is removed.  Having kidney disease or liver disease. What increases the risk? You are more likely to get this condition if:  You are older.  You do not spend much time outdoors.  You live in a nursing home.  You have had broken bones.  You have weak or thin bones (osteoporosis).  You have a disease or condition that changes how your body absorbs vitamin D.  You have dark skin.  You take certain medicines.  You are overweight or obese. What are the signs or symptoms?  In mild cases, there may not be any symptoms. If the condition is very bad, symptoms may include: ? Bone pain. ? Muscle pain. ? Falling often. ? Broken bones caused by a minor injury. How is this treated? Treatment may include taking supplements as told by your doctor. Your doctor will tell you what dose  is best for you. Supplements may include:  Vitamin D.  Calcium. Follow these instructions at home: Eating and drinking  Eat foods that contain vitamin D, such as: ? Dairy products, cereals, or juices with added vitamin D. Check the label. ? Fish, such as salmon or trout. ? Eggs. ? Oysters. ? Mushrooms. The items listed above may not be a complete list of what you can eat and drink. Contact a dietitian for more options.   General instructions  Take medicines and supplements only as told by your doctor.  Get regular, safe exposure to natural sunlight.  Do not use a tanning bed.  Maintain a healthy weight. Lose weight if needed.  Keep all follow-up visits as told by your doctor. This is important. How is this prevented?  You can get vitamin D by: ? Eating foods that naturally contain vitamin D. ? Eating or drinking products that have vitamin D added to them, such as cereals, juices, and milk. ? Taking vitamin D or a multivitamin that contains vitamin D. ?  Being in the sun. Your body makes vitamin D when your skin is exposed to sunlight. Your body changes the sunlight into a form of the vitamin that it can use. Contact a doctor if:  Your symptoms do not go away.  You feel sick to your stomach (nauseous).  You throw up (vomit).  You poop less often than normal, or you have trouble pooping (constipation). Summary  Vitamin D deficiency is when your body does not have enough vitamin D.  Vitamin D helps to keep your bones strong and healthy.  This condition is often treated by taking a supplement.  Your doctor will tell you what dose is best for you. This information is not intended to replace advice given to you by your health care provider. Make sure you discuss any questions you have with your health care provider. Document Revised: 08/09/2018 Document Reviewed: 08/09/2018 Elsevier Patient Education  2021 [place]. Metformin Extended-Release Tablets What is this  medicine? METFORMIN (met FOR min) is used to treat type 2 diabetes. It helps to control blood sugar. Treatment is combined with diet and exercise. This medicine can be used alone or with other medicines for diabetes. This medicine may be used for other purposes; ask your health care provider or pharmacist if you have questions. COMMON BRAND NAME(S): Fortamet, Glucophage XR, Glumetza What should I tell my health care provider before I take this medicine? They need to know if you have any of these conditions:  anemia  dehydration  heart disease  frequently drink alcohol-containing beverages  kidney disease  liver disease  polycystic ovary syndrome  serious infection or injury  vomiting  an unusual or allergic reaction to metformin, other medicines, foods, dyes, or preservatives  pregnant or trying to get pregnant  breast-feeding How should I use this medicine? Take this medicine by mouth with a glass of water. Follow the directions on the prescription label. Take this medicine with food. Take your medicine at regular intervals. Do not take your medicine more often than directed. Do not stop taking except on your doctor's advice. Talk to your pediatrician regarding the use of this medicine in children. Special care may be needed. Overdosage: If you think you have taken too much of this medicine contact a poison control center or emergency room at once. NOTE: This medicine is only for you. Do not share this medicine with others. What if I miss a dose? If you miss a dose, take it as soon as you can. If it is almost time for your next dose, take only that dose. Do not take double or extra doses. What may interact with this medicine? Do not take this medicine with any of the following medications:  certain contrast medicines given before X-rays, CT scans, MRI, or other procedures  dofetilide This medicine may also interact with the following  medications:  acetazolamide  alcohol  certain antivirals for HIV or hepatitis  certain medicines for blood pressure, heart disease, irregular heart beat  cimetidine  dichlorphenamide  digoxin  diuretics  male hormones, like estrogens or progestins and birth control pills  glycopyrrolate  isoniazid  lamotrigine  memantine  methazolamide  metoclopramide  midodrine  niacin  phenothiazines like chlorpromazine, mesoridazine, prochlorperazine, thioridazine  phenytoin  ranolazine  steroid medicines like prednisone or cortisone  stimulant medicines for attention disorders, weight loss, or to stay awake  thyroid medicines  topiramate  trospium  vandetanib  zonisamide This list may not describe all possible interactions. Give your health care provider  a list of all the medicines, herbs, non-prescription drugs, or dietary supplements you use. Also tell them if you smoke, drink alcohol, or use illegal drugs. Some items may interact with your medicine. What should I watch for while using this medicine? Visit your doctor or health care professional for regular checks on your progress. A test called the HbA1C (A1C) will be monitored. This is a simple blood test. It measures your blood sugar control over the last 2 to 3 months. You will receive this test every 3 to 6 months. Learn how to check your blood sugar. Learn the symptoms of low and high blood sugar and how to manage them. Always carry a quick-source of sugar with you in case you have symptoms of low blood sugar. Examples include hard sugar candy or glucose tablets. Make sure others know that you can choke if you eat or drink when you develop serious symptoms of low blood sugar, such as seizures or unconsciousness. They must get medical help at once. Tell your doctor or health care professional if you have high blood sugar. You might need to change the dose of your medicine. If you are sick or exercising more  than usual, you might need to change the dose of your medicine. Do not skip meals. Ask your doctor or health care professional if you should avoid alcohol. Many nonprescription cough and cold products contain sugar or alcohol. These can affect blood sugar. This medicine may cause ovulation in premenopausal women who do not have regular monthly periods. This may increase your chances of becoming pregnant. You should not take this medicine if you become pregnant or think you may be pregnant. Talk with your doctor or health care professional about your birth control options while taking this medicine. Contact your doctor or health care professional right away if you think you are pregnant. The tablet shell for some brands of this medicine does not dissolve. This is normal. The tablet shell may appear whole in the stool. This is not a cause for concern. If you are going to need surgery, a MRI, CT scan, or other procedure, tell your doctor that you are taking this medicine. You may need to stop taking this medicine before the procedure. Wear a medical ID bracelet or chain, and carry a card that describes your disease and details of your medicine and dosage times. This medicine may cause a decrease in folic acid and vitamin B12. You should make sure that you get enough vitamins while you are taking this medicine. Discuss the foods you eat and the vitamins you take with your health care professional. What side effects may I notice from receiving this medicine? Side effects that you should report to your doctor or health care professional as soon as possible:  allergic reactions like skin rash, itching or hives, swelling of the face, lips, or tongue  breathing problems  feeling faint or lightheaded, falls  muscle aches or pains  signs and symptoms of low blood sugar such as feeling anxious, confusion, dizziness, increased hunger, unusually weak or tired, sweating, shakiness, cold, irritable, headache,  blurred vision, fast heartbeat, loss of consciousness  slow or irregular heartbeat  unusual stomach pain or discomfort  unusually tired or weak Side effects that usually do not require medical attention (report to your doctor or health care professional if they continue or are bothersome):  diarrhea  headache  heartburn  metallic taste in mouth  nausea  stomach gas, upset This list may not describe all  possible side effects. Call your doctor for medical advice about side effects. You may report side effects to FDA at 1-800-FDA-1088. Where should I keep my medicine? Keep out of the reach of children. Store at room temperature between 15 and 30 degrees C (59 and 86 degrees F). Protect from light. Throw away any unused medicine after the expiration date. NOTE: This sheet is a summary. It may not cover all possible information. If you have questions about this medicine, talk to your doctor, pharmacist, or health care provider.  2021 Elsevier/Gold Standard (2020-10-28 10:29:57) Diabetes Mellitus Action Plan Following a diabetes action plan is a way for you to manage your diabetes (diabetes mellitus) symptoms. The plan is color-coded to help you understand what actions you need to take based on any symptoms you are having.  If you have symptoms in the red zone, you need medical care right away.  If you have symptoms in the yellow zone, you are having problems.  If you have symptoms in the green zone, you are doing well. Learning about and understanding diabetes can take time. Follow the plan that you develop with your health care provider. Know the target range for your blood sugar (glucose) level, and review your treatment plan with your health care provider at each visit. The target range for my blood sugar level is __________________________ mg/dL. Red zone Get medical help right away if you have any of the following symptoms:  A blood sugar test result that is below 54 mg/dL (3  mmol/L).  A blood sugar test result that is at or above 240 mg/dL (13.3 mmol/L) for 2 days in a row.  Confusion or trouble thinking clearly.  Difficulty breathing.  Sickness or a fever for 2 or more days that is not getting better.  Moderate or large ketone levels in your urine.  Feeling tired or having no energy. If you have any red zone symptoms, do not wait to see if the symptoms will go away. Get medical help right away. Call your local emergency services (911 in the [place] Do not drive yourself to the hospital. If you have severely low blood sugar (severe hypoglycemia) and you cannot eat or drink, you may need glucagon. Make sure a family member or close friend knows how to check your blood sugar and how to give you glucagon. You may need to be treated in a hospital for this condition.   Yellow zone If you have any of the following symptoms, your diabetes is not under control and you may need to make some changes:  A blood sugar test result that is at or above 240 mg/dL (13.3 mmol/L) for 2 days in a row.  Blood sugar test results that are below 70 mg/dL (3.9 mmol/L).  Other symptoms of hypoglycemia, such as: ? Shaking or feeling light-headed. ? Confusion or irritability. ? Feeling hungry. ? Having a fast heartbeat. If you have any yellow zone symptoms:  Treat your hypoglycemia by eating or drinking 15 grams of a rapid-acting carbohydrate. Follow the 15:15 rule: ? Take 15 grams of a rapid-acting carbohydrate, such as:  1 tube of glucose gel.  4 glucose pills.  4 oz (120 mL) of fruit juice.  4 oz (120 mL) of regular (not diet) soda. ? Check your blood sugar 15 minutes after you take the carbohydrate. ? If the repeat blood sugar test is still at or below 70 mg/dL (3.9 mmol/L), take 15 grams of a carbohydrate again. ? If your blood sugar does  not increase above 70 mg/dL (3.9 mmol/L) after 3 tries, get medical help right away. ? After your blood sugar returns to normal, eat  a meal or a snack within 1 hour.  Keep taking your daily medicines as told by your health care provider.  Check your blood sugar more often than you normally would. ? Write down your results. ? Call your health care provider if you have trouble keeping your blood sugar in your target range.   Green zone These signs mean you are doing well and you can continue what you are doing to manage your diabetes:  Your blood sugar is within your personal target range. For most people, a blood sugar level before a meal (preprandial) should be 80-130 mg/dL (4.4-7.2 mmol/L).  You feel well, and you are able to do daily activities. If you are in the green zone, continue to manage your diabetes as told by your health care provider. To do this:  Eat a healthy diet.  Exercise regularly.  Check your blood sugar as told by your health care provider.  Take your medicines as told by your health care provider.   Where to find more information  American Diabetes Association (ADA): diabetes.org  Association of Diabetes Care & Education Specialists (ADCES): diabeteseducator.org Summary  Following a diabetes action plan is a way for you to manage your diabetes symptoms. The plan is color-coded to help you understand what actions you need to take based on any symptoms you are having.  Follow the plan that you develop with your health care provider. Make sure you know your personal target blood sugar level.  Review your treatment plan with your health care provider at each visit. This information is not intended to replace advice given to you by your health care provider. Make sure you discuss any questions you have with your health care provider. Document Revised: 06/07/2020 Document Reviewed: 06/07/2020 Elsevier Patient Education  [place].

## 2021-04-23 ENCOUNTER — Other Ambulatory Visit: Payer: Self-pay | Admitting: Gerontology

## 2021-04-23 DIAGNOSIS — E559 Vitamin D deficiency, unspecified: Secondary | ICD-10-CM

## 2021-06-06 ENCOUNTER — Other Ambulatory Visit: Payer: Self-pay

## 2021-06-06 ENCOUNTER — Other Ambulatory Visit: Payer: Medicaid Other

## 2021-06-06 DIAGNOSIS — R7401 Elevation of levels of liver transaminase levels: Secondary | ICD-10-CM

## 2021-06-06 DIAGNOSIS — E119 Type 2 diabetes mellitus without complications: Secondary | ICD-10-CM

## 2021-06-06 DIAGNOSIS — E559 Vitamin D deficiency, unspecified: Secondary | ICD-10-CM

## 2021-06-07 LAB — COMPREHENSIVE METABOLIC PANEL
ALT: 23 IU/L (ref 0–44)
AST: 22 IU/L (ref 0–40)
Albumin/Globulin Ratio: 1.9 (ref 1.2–2.2)
Albumin: 4.7 g/dL (ref 3.8–4.9)
Alkaline Phosphatase: 113 IU/L (ref 44–121)
BUN/Creatinine Ratio: 20 (ref 9–20)
BUN: 20 mg/dL (ref 6–24)
Bilirubin Total: 1.4 mg/dL — ABNORMAL HIGH (ref 0.0–1.2)
CO2: 23 mmol/L (ref 20–29)
Calcium: 11.2 mg/dL — ABNORMAL HIGH (ref 8.7–10.2)
Chloride: 105 mmol/L (ref 96–106)
Creatinine, Ser: 1.01 mg/dL (ref 0.76–1.27)
Globulin, Total: 2.5 g/dL (ref 1.5–4.5)
Glucose: 123 mg/dL — ABNORMAL HIGH (ref 65–99)
Potassium: 4.4 mmol/L (ref 3.5–5.2)
Sodium: 139 mmol/L (ref 134–144)
Total Protein: 7.2 g/dL (ref 6.0–8.5)
eGFR: 90 mL/min/{1.73_m2} (ref >59)

## 2021-06-07 LAB — HEMOGLOBIN A1C
Est. average glucose Bld gHb Est-mCnc: 131 mg/dL
Hgb A1c MFr Bld: 6.2 % — ABNORMAL HIGH (ref 4.8–5.6)

## 2021-06-07 LAB — PTH, INTACT AND CALCIUM: PTH: 33 pg/mL (ref 15–65)

## 2021-06-07 LAB — VITAMIN D 25 HYDROXY (VIT D DEFICIENCY, FRACTURES): Vit D, 25-Hydroxy: 30.7 ng/mL (ref 30.0–100.0)

## 2021-06-20 ENCOUNTER — Encounter: Payer: Self-pay | Admitting: Physician Assistant

## 2021-06-20 ENCOUNTER — Ambulatory Visit: Payer: Medicaid Other | Admitting: Physician Assistant

## 2021-06-20 ENCOUNTER — Other Ambulatory Visit: Payer: Self-pay

## 2021-06-20 VITALS — BP 132/82 | HR 68 | Ht 68.0 in | Wt 184.7 lb

## 2021-06-20 DIAGNOSIS — R7303 Prediabetes: Secondary | ICD-10-CM

## 2021-06-20 DIAGNOSIS — R799 Abnormal finding of blood chemistry, unspecified: Secondary | ICD-10-CM

## 2021-06-20 MED ORDER — VITAMIN D 25 MCG (1000 UNIT) PO TABS: 1000.0000 [IU] | ORAL_TABLET | Freq: Every day | ORAL | 2 refills | Status: AC

## 2021-06-20 NOTE — Progress Notes (Signed)
Patient: Roberto Moore Male    DOB: [birth year]   50 y.o.   MRN: [id number] Visit Date: 06/20/2021  Today's Provider: Elwyn Reach, [name]   Chief Complaint  Patient presents with   Follow-up    Follow up on blood work and Vitamin D levels. Checkup   Subjective:    Roberto Moore returns for follow-up. He has no concerns or new or worsening symptoms; to the contrary, actually, he reports his previous left upper extremity symptoms have resolved completely with typical approaches, including use of OTC topical agents (he found greatest relief from intermittent use of OTC diclofenac).  He reports having improved his diet, no longer drinking sugar-sweetened beverages and also reducing consumption of simple carbohydrates. He has lost a little weight (4 lbs). His A1c at last visit was 6.5, when he was started on metformin 500 mg daily, and it was 6.2 on most recent check.  His eye appt is still pending.  We reviewed his labs in detail. Further details appear in assessment and plan.    Allergies  Allergen Reactions   Bee Pollen    Previous Medications   METFORMIN (GLUCOPHAGE) 1000 MG TABLET    Take 0.5 tablets (500 mg total) by mouth daily with breakfast.   Review of systems (may be based on changes from baseline unless otherwise stated) See HPI as well, with ROS here building on that in HPI General: No fever, chills, night sweats, anorexia, malaise, fatigue, or weight change Eyes: No worsening of vision, double vision, or other ocular symptoms (latter asked generically) Respiratory: No SOB, DOE, cough, wheezing, hemoptysis, orthopnea, or PND Cardiovascular: No chest pain; chest pressure; pain or pressure in neck, arm, jaw, or epigastric area; palpitations; or edema Gastrointestinal: No nausea, vomiting, abdominal pain, diarrhea, indigestion, or change in bowel habits Musculoskeletal: No spasms, chronic pain, or other musculoskeletal symptoms (latter asked generically) Neurologic: No numbness,  weakness, "dizziness or lightheadedness"  Social History   Tobacco Use   Smoking status: Never   Smokeless tobacco: Never  Substance Use Topics   Alcohol use: Yes    Alcohol/week: 4.0 standard drinks    Types: 4 Glasses of wine per week   Objective:   BP 132/82 (BP Location: Right Arm)   Pulse 68   Ht 5' 8"  (1.727 m)   Wt 184 lb 11.2 oz (83.8 kg)   BMI 28.08 kg/m   Physical exam Constitutional: A&Ox3, NAD, pleasant, cooperative HEENT: [place]/AT, EOM grossly intact Respiratory: Breathing unlabored, good and symmetric chest expansion and air entry, lungs clear to auscultation bilaterally Cardiovascular: RRR, S1, S2; no S3 or S4 appreciated; no murmurs, clicks, gallops, rubs GI: NABS in all 4 quadrants; abdomen soft, non-tender, and non-distended; no masses, no organomegaly     Assessment & Plan:   Type II DM vs. impaired glucose tolerance -A1c in April was 6.5, which is just at threshold for dxing diabetes, but generally want to repeat labs to confirm absent unequivocal symptomatic hyperglycemia -metformin started at 500 mg daily along w/ lifestyle intervention -A1c in late June was 6.2 -I do not think metformin is needed for him. Even if he has diabetes, his A1c of 6.5 would be <7 even w/out metformin. If he has so-called "prediabetes", metformin has questionable value for improvement of patient-relevant outcomes (like prevention or delay of neuropathy, kidney disease, cardiovascular events, etc.); presently, evidence for use of metformin in such people is limited to reducing or delaying progression to diabetes, which is not a patient-relevant outcome unto itself (but rather,  a surrogate). As such, despite some guidelines recommending initiation of metformin in people with so-called "prediabetes", the benefit of metformin is unclear at best in this group. -We disc R/B/A of holding metformin, having him cont w/ lifestyle interventions, and reassessing A1c in another 3 months; he agrees  w/ this plan  Hypercalcemia -Mild (hovering around 110.4, with most recent readings being 11.4 in April of this year and 11.2 in June of this year), longstanding (as far back as 2017), asymptomatic elevation points most immeditely toward primary hyperparathyroidism as potential cause -Disc w/ him, and will obtain some add'l labs as part of next 15-monthlabs (PTH and calcium), but no indication for urgent evaluation now; disc. s/s warranting further attn  Hyperbilirubinemia -Mild (1.3 to 1.6, with most recent msmts being 1.4 in April of this year and 1.4 in June of this year), longstanding (as far back as 2017), seemingly asymptomatic (though had cholecystectomy 5-7 yrs ago per his report) -His alk phos has occasionally been very mildly elevated, but has always self-resolved w/out intervention or issue. Most recently, it is within normal limits at 113. I mention alk phos b/c the combination of alk phos and bili elevation could suggest a cholestatic pattern, but I am not convinced this is the most likely explanation based on his overall picture. It can, of course, remain on DDx, and if further eval. for cholestatic pattern becomes appropriate, would likely start w/ RUQ U/S unless compelling reasons to start elsewhere. -Other explanations for bili elevation could include hemolysis (can cause unconjugated hyperbili) or inherited disorders of bilirubin metabolism (e.g., GRosanna Randyand Crigler-Najjar for isolated unconjugated hyperbili and Rotor and Dubin-Johnson for isolated conjugated hyperbili) -Disc w/ him, and will obtain some add'l labs as part of next 344-monthabs (fractionated bili to give insight into direct/conjugated vs. indirect/unconjugated, but no indication for urgent evaluation now; disc. s/s warranting further attn  Other -F/u in 3 mos., sooner if needed; call or seek care if ?s, concerns, or new or worsening signs, symptoms, or conditions  MaElwyn ReachPA-C   [facility]

## 2021-09-03 ENCOUNTER — Ambulatory Visit: Payer: Medicaid Other | Admitting: Gerontology

## 2021-09-03 ENCOUNTER — Other Ambulatory Visit: Payer: Self-pay

## 2021-09-03 ENCOUNTER — Encounter: Payer: Self-pay | Admitting: Gerontology

## 2021-09-03 VITALS — BP 125/79 | HR 70 | Temp 98.4°F | Resp 16 | Wt 187.7 lb

## 2021-09-03 DIAGNOSIS — M79602 Pain in left arm: Secondary | ICD-10-CM

## 2021-09-03 DIAGNOSIS — R7303 Prediabetes: Secondary | ICD-10-CM

## 2021-09-03 MED ORDER — IBUPROFEN 800 MG PO TABS: 800.0000 mg | ORAL_TABLET | Freq: Two times a day (BID) | ORAL | 0 refills | Status: DC

## 2021-09-03 MED ORDER — PREDNISONE 10 MG PO TABS: 10.0000 mg | ORAL_TABLET | Freq: Every day | ORAL | 0 refills | Status: DC

## 2021-09-03 NOTE — Progress Notes (Signed)
Established Patient Office Visit  Subjective:  Patient ID: Roberto Moore, male    DOB: [birth year]  Age: 50 y.o. MRN: [id number]  CC: No chief complaint on file.   HPI Roberto Moore  is a 50 y/o male who has history of  Allergy, Carpal tunnel syndrome, Insomnia, Osteoarthritis of both hands, presents for c/o constant radiating pain that originates from the medial ante cubital area of her left arm. He states that pain started after many trials of intravenous insertion to his left ante cubital (ac) area to obtain plasma from his left arm 2 months ago. He states that pain radiates to his shoulder and fore arm. He states that pain is constant at ac area, describes pain as dull, sharp and intensity increases from 5-8/10. He states that pushing , picking up objects aggravates pain and he can't hold objects for more than 1 hour. He states that taking Motrin, and using heating pad minimally relieves symptoms. He denies swelling, redness to ac area, and paresthesia. Overall, he states that he's doing well and offers no further complaint.  Past Medical History:  Diagnosis Date   Allergy    Carpal tunnel syndrome    Insomnia 08/05/2016   Osteoarthritis of both hands 07/14/2017    Past Surgical History:  Procedure Laterality Date   CHOLECYSTECTOMY      Family History  Problem Relation Age of Onset   Arthritis Mother    Cancer Father     Social History   Socioeconomic History   Marital status: Significant Other    Spouse name: Not on file   Number of children: Not on file   Years of education: Not on file   Highest education level: Not on file  Occupational History    Employer: [employer]  Tobacco Use   Smoking status: Never   Smokeless tobacco: Never  Vaping Use   Vaping Use: Never used  Substance and Sexual Activity   Alcohol use: Yes    Alcohol/week: 4.0 standard drinks    Types: 4 Glasses of wine per week   Drug use: No   Sexual activity: Not Currently  Other Topics Concern   Not  on file  Social History Narrative   Not on file   Social Determinants of Health   Financial Resource Strain: Low Risk    Difficulty of Paying Living Expenses: Not hard at all  Food Insecurity: No Food Insecurity   Worried About Charity fundraiser in the Last Year: Never true   [place] in the Last Year: Never true  Transportation Needs: No Transportation Needs   Lack of Transportation (Medical): No   Lack of Transportation (Non-Medical): No  Physical Activity: Sufficiently Active   Days of Exercise per Week: 3 days   Minutes of Exercise per Session: 60 min  Stress: Stress Concern Present   Feeling of Stress : To some extent  Social Connections: Unknown   Frequency of Communication with Friends and Family: More than three times a week   Frequency of Social Gatherings with Friends and Family: More than three times a week   Attends Religious Services: More than 4 times per year   Active Member of [employer] or Organizations: Yes   Attends Music therapist: More than 4 times per year   Marital Status: Not on file  Intimate Partner Violence: Not At Risk   Fear of Current or Ex-Partner: No   Emotionally Abused: No   Physically Abused: No   Sexually  Abused: No    Outpatient Medications Prior to Visit  Medication Sig Dispense Refill   cholecalciferol (VITAMIN D3) 25 MCG (1000 UNIT) tablet Take 1 tablet (1,000 Units total) by mouth daily. 30 tablet 2   metFORMIN (GLUCOPHAGE) 1000 MG tablet Take 0.5 tablets (500 mg total) by mouth daily with breakfast. 90 tablet 0   No facility-administered medications prior to visit.    Allergies  Allergen Reactions   Bee Pollen     ROS Review of Systems  Constitutional: Negative.   Respiratory: Negative.    Cardiovascular: Negative.   Musculoskeletal:  Positive for arthralgias (left arm pain).     Objective:    Physical Exam Cardiovascular:     Rate and Rhythm: Normal rate and regular rhythm.     Pulses: Normal  pulses.     Heart sounds: Normal heart sounds.  Pulmonary:     Effort: Pulmonary effort is normal.     Breath sounds: Normal breath sounds.  Musculoskeletal:        General: Tenderness (with palpating the medial aspect of his left antecubital area) present.       Arms:  Neurological:     General: No focal deficit present.     Mental Status: He is alert and oriented to person, place, and time. Mental status is at baseline.    BP 125/79 (BP Location: Right Arm, Patient Position: Sitting, Cuff Size: Large)   Pulse 70   Temp 98.4 F (36.9 C)   Resp 16   Wt 187 lb 11.2 oz (85.1 kg)   SpO2 96%   BMI 28.54 kg/m  Wt Readings from Last 3 Encounters:  09/03/21 187 lb 11.2 oz (85.1 kg)  06/20/21 184 lb 11.2 oz (83.8 kg)  04/18/21 188 lb 11.2 oz (85.6 kg)     Health Maintenance Due  Topic Date Due   URINE MICROALBUMIN  Never done   Hepatitis C Screening  Never done   COLONOSCOPY (Pts 45-88yr Insurance coverage will need to be confirmed)  Never done   Zoster Vaccines- Shingrix (1 of 2) Never done   COVID-19 Vaccine (4 - Booster) 05/25/2021   INFLUENZA VACCINE  Never done    There are no preventive care reminders to display for this patient.  Lab Results  Component Value Date   TSH 1.850 03/21/2021   Lab Results  Component Value Date   WBC 6.7 03/21/2021   HGB 15.2 03/21/2021   HCT 46.1 03/21/2021   MCV 81 03/21/2021   PLT 291 03/21/2021   Lab Results  Component Value Date   NA 139 06/06/2021   K 4.4 06/06/2021   CO2 23 06/06/2021   GLUCOSE 123 (H) 06/06/2021   BUN 20 06/06/2021   CREATININE 1.01 06/06/2021   BILITOT 1.4 (H) 06/06/2021   ALKPHOS 113 06/06/2021   AST 22 06/06/2021   ALT 23 06/06/2021   PROT 7.2 06/06/2021   ALBUMIN 4.7 06/06/2021   CALCIUM 11.2 (H) 06/06/2021   ANIONGAP 5 07/15/2020   EGFR 90 06/06/2021   Lab Results  Component Value Date   CHOL 157 03/21/2021   Lab Results  Component Value Date   HDL 50 03/21/2021   Lab Results   Component Value Date   LDLCALC 93 03/21/2021   Lab Results  Component Value Date   TRIG 75 03/21/2021   Lab Results  Component Value Date   CHOLHDL 2.7 07/14/2017   Lab Results  Component Value Date   HGBA1C 6.2 (H) 06/06/2021  Assessment & Plan:    1. Prediabetes -He was encouraged to continue on low carb/non concentrated sweet diet and exercise as tolerated. - Will check Urine Microalbumin w/creat. ratio; Future  2. Left arm pain -No erythema, swelling to antecubital area. Ddx likely muscle sprain, will continue on Prednisone 10 mg x 5 day and resume Ibuprofen 800 mg afterwards. He was educated on medication side effects and advised to notify clinic. He was advised to notify the clinic for worsening symptoms. - predniSONE (DELTASONE) 10 MG tablet; Take 1 tablet (10 mg total) by mouth daily with breakfast.  Dispense: 5 tablet; Refill: 0 - ibuprofen (ADVIL) 800 MG tablet; Take 1 tablet (800 mg total) by mouth 2 (two) times daily.  Dispense: 14 tablet; Refill: 0     Follow-up: Return in about 23 days (around 09/26/2021), or if symptoms worsen or fail to improve.    Naydene Kamrowski Jerold Coombe, NP

## 2021-09-11 ENCOUNTER — Other Ambulatory Visit: Payer: Medicaid Other

## 2021-09-19 ENCOUNTER — Other Ambulatory Visit: Payer: Self-pay

## 2021-09-19 ENCOUNTER — Other Ambulatory Visit: Payer: Medicaid Other

## 2021-09-19 ENCOUNTER — Ambulatory Visit: Payer: Medicaid Other

## 2021-09-19 DIAGNOSIS — R7303 Prediabetes: Secondary | ICD-10-CM

## 2021-09-19 DIAGNOSIS — R799 Abnormal finding of blood chemistry, unspecified: Secondary | ICD-10-CM

## 2021-09-20 LAB — COMPREHENSIVE METABOLIC PANEL
ALT: 25 IU/L (ref 0–44)
AST: 19 IU/L (ref 0–40)
Albumin/Globulin Ratio: 1.7 (ref 1.2–2.2)
Albumin: 4.8 g/dL (ref 3.8–4.9)
Alkaline Phosphatase: 152 IU/L — ABNORMAL HIGH (ref 44–121)
BUN/Creatinine Ratio: 12 (ref 9–20)
BUN: 13 mg/dL (ref 6–24)
Bilirubin Total: 0.8 mg/dL (ref 0.0–1.2)
CO2: 22 mmol/L (ref 20–29)
Calcium: 11.4 mg/dL — ABNORMAL HIGH (ref 8.7–10.2)
Chloride: 105 mmol/L (ref 96–106)
Creatinine, Ser: 1.08 mg/dL (ref 0.76–1.27)
Globulin, Total: 2.9 g/dL (ref 1.5–4.5)
Glucose: 128 mg/dL — ABNORMAL HIGH (ref 70–99)
Potassium: 4.6 mmol/L (ref 3.5–5.2)
Sodium: 141 mmol/L (ref 134–144)
Total Protein: 7.7 g/dL (ref 6.0–8.5)
eGFR: 83 mL/min/{1.73_m2} (ref >59)

## 2021-09-20 LAB — BILIRUBIN, FRACTIONATED(TOT/DIR/INDIR)
Bilirubin, Direct: 0.18 mg/dL (ref 0.00–0.40)
Bilirubin, Indirect: 0.62 mg/dL (ref 0.10–0.80)

## 2021-09-20 LAB — MICROALBUMIN / CREATININE URINE RATIO
Creatinine, Urine: 151.7 mg/dL
Microalb/Creat Ratio: 3 mg/g creat (ref 0–29)
Microalbumin, Urine: 3.8 ug/mL

## 2021-09-25 ENCOUNTER — Other Ambulatory Visit: Payer: Self-pay

## 2021-09-25 ENCOUNTER — Other Ambulatory Visit: Payer: Medicaid Other

## 2021-09-26 ENCOUNTER — Encounter: Payer: Self-pay | Admitting: Gerontology

## 2021-09-26 ENCOUNTER — Ambulatory Visit: Payer: Medicaid Other | Admitting: Gerontology

## 2021-09-26 VITALS — BP 128/79 | HR 69 | Temp 97.6°F | Resp 16 | Wt 190.5 lb

## 2021-09-26 DIAGNOSIS — E119 Type 2 diabetes mellitus without complications: Secondary | ICD-10-CM

## 2021-09-26 DIAGNOSIS — R7303 Prediabetes: Secondary | ICD-10-CM

## 2021-09-26 LAB — PTH, INTACT AND CALCIUM
Calcium: 11.4 mg/dL — ABNORMAL HIGH (ref 8.7–10.2)
PTH: 50 pg/mL (ref 15–65)

## 2021-09-26 LAB — HEMOGLOBIN A1C
Est. average glucose Bld gHb Est-mCnc: 137 mg/dL
Hgb A1c MFr Bld: 6.4 % — ABNORMAL HIGH (ref 4.8–5.6)

## 2021-09-26 MED ORDER — METFORMIN HCL 1000 MG PO TABS: 500.0000 mg | ORAL_TABLET | Freq: Two times a day (BID) | ORAL | 3 refills | Status: DC

## 2021-09-26 NOTE — Progress Notes (Signed)
 Established Patient Office Visit  Subjective:  Patient ID: Roberto Moore, male    DOB: 07/30/1971  Age: 50 y.o. MRN: [id number]  CC: No chief complaint on file.   HPI Roberto Moore is a 50 y/o male who has history of  Allergy, Carpal tunnel syndrome, insomnia, OA of both hands, presents for lab review and medication refill. His HgbA1c done on 09/25/21 increased from 6.2% to 6.4%, his calcium is the same at 11.4 mg/dl and PTH was 50. He states that he's complaint with his medications, denies side effects and continues to make healthy lifestyle changes. He states that the pain in his left arm has improved 6/10 with activity and 2/10 at rest and he continues to take 200 mg Advil as needed. He denies bone pain, irritability, confusion and constipation. Overall, he states that he's doing well and offers no further complaint.  Past Medical History:  Diagnosis Date   Allergy    Carpal tunnel syndrome    Insomnia 08/05/2016   Osteoarthritis of both hands 07/14/2017    Past Surgical History:  Procedure Laterality Date   CHOLECYSTECTOMY      Family History  Problem Relation Age of Onset   Arthritis Mother    Cancer Father     Social History   Socioeconomic History   Marital status: Significant Other    Spouse name: Not on file   Number of children: Not on file   Years of education: Not on file   Highest education level: Not on file  Occupational History    Employer: [employer]  Tobacco Use   Smoking status: Never   Smokeless tobacco: Never  Vaping Use   Vaping Use: Never used  Substance and Sexual Activity   Alcohol use: Yes    Alcohol/week: 4.0 standard drinks    Types: 4 Glasses of wine per week   Drug use: No   Sexual activity: Not Currently  Other Topics Concern   Not on file  Social History Narrative   Not on file   Social Determinants of Health   Financial Resource Strain: Low Risk    Difficulty of Paying Living Expenses: Not hard at all  Food Insecurity: No Food  Insecurity   Worried About Running Out of Food in the Last Year: Never true   Ran Out of Food in the Last Year: Never true  Transportation Needs: No Transportation Needs   Lack of Transportation (Medical): No   Lack of Transportation (Non-Medical): No  Physical Activity: Sufficiently Active   Days of Exercise per Week: 3 days   Minutes of Exercise per Session: 60 min  Stress: Stress Concern Present   Feeling of Stress : To some extent  Social Connections: Unknown   Frequency of Communication with Friends and Family: More than three times a week   Frequency of Social Gatherings with Friends and Family: More than three times a week   Attends Religious Services: More than 4 times per year   Active Member of Clubs or Organizations: Yes   Attends Club or Organization Meetings: More than 4 times per year   Marital Status: Not on file  Intimate Partner Violence: Not At Risk   Fear of Current or Ex-Partner: No   Emotionally Abused: No   Physically Abused: No   Sexually Abused: No    Outpatient Medications Prior to Visit  Medication Sig Dispense Refill   ibuprofen (ADVIL) 800 MG tablet Take 1 tablet (800 mg total) by mouth 2 (two) times daily. 14   tablet 0   metFORMIN (GLUCOPHAGE) 1000 MG tablet Take 0.5 tablets (500 mg total) by mouth daily with breakfast. 90 tablet 0   predniSONE (DELTASONE) 10 MG tablet Take 1 tablet (10 mg total) by mouth daily with breakfast. (Patient not taking: Reported on 09/26/2021) 5 tablet 0   No facility-administered medications prior to visit.    Allergies  Allergen Reactions   Bee Pollen     ROS Review of Systems  Constitutional: Negative.   Respiratory: Negative.    Cardiovascular: Negative.   Gastrointestinal: Negative.   Musculoskeletal: Negative.   Neurological: Negative.   Psychiatric/Behavioral: Negative.       Objective:    Physical Exam HENT:     Head: Normocephalic and atraumatic.     Mouth/Throat:     Mouth: Mucous membranes are  moist.  Cardiovascular:     Rate and Rhythm: Normal rate and regular rhythm.     Pulses: Normal pulses.     Heart sounds: Normal heart sounds.  Pulmonary:     Effort: Pulmonary effort is normal.     Breath sounds: Normal breath sounds.  Abdominal:     General: Bowel sounds are normal.     Palpations: Abdomen is soft.  Skin:    General: Skin is warm.  Neurological:     General: No focal deficit present.     Mental Status: He is alert and oriented to person, place, and time. Mental status is at baseline.  Psychiatric:        Mood and Affect: Mood normal.        Behavior: Behavior normal.        Thought Content: Thought content normal.        Judgment: Judgment normal.    BP 128/79 (BP Location: Left Arm, Patient Position: Sitting, Cuff Size: Large)   Pulse 69   Temp 97.6 F (36.4 C)   Resp 16   Wt 190 lb 8 oz (86.4 kg)   BMI 28.97 kg/m  Wt Readings from Last 3 Encounters:  09/26/21 190 lb 8 oz (86.4 kg)  09/03/21 187 lb 11.2 oz (85.1 kg)  06/20/21 184 lb 11.2 oz (83.8 kg)     Health Maintenance Due  Topic Date Due   Hepatitis C Screening  Never done   COLONOSCOPY (Pts 45-49yrs Insurance coverage will need to be confirmed)  Never done   Zoster Vaccines- Shingrix (1 of 2) Never done   COVID-19 Vaccine (4 - Booster) 05/25/2021   INFLUENZA VACCINE  Never done    There are no preventive care reminders to display for this patient.  Lab Results  Component Value Date   TSH 1.850 03/21/2021   Lab Results  Component Value Date   WBC 6.7 03/21/2021   HGB 15.2 03/21/2021   HCT 46.1 03/21/2021   MCV 81 03/21/2021   PLT 291 03/21/2021   Lab Results  Component Value Date   NA 141 09/19/2021   K 4.6 09/19/2021   CO2 22 09/19/2021   GLUCOSE 128 (H) 09/19/2021   BUN 13 09/19/2021   CREATININE 1.08 09/19/2021   BILITOT 0.8 09/19/2021   ALKPHOS 152 (H) 09/19/2021   AST 19 09/19/2021   ALT 25 09/19/2021   PROT 7.7 09/19/2021   ALBUMIN 4.8 09/19/2021   CALCIUM 11.4  (H) 09/25/2021   ANIONGAP 5 07/15/2020   EGFR 83 09/19/2021   Lab Results  Component Value Date   CHOL 157 03/21/2021   Lab Results  Component Value Date     HDL 50 03/21/2021   Lab Results  Component Value Date   LDLCALC 93 03/21/2021   Lab Results  Component Value Date   TRIG 75 03/21/2021   Lab Results  Component Value Date   CHOLHDL 2.7 07/14/2017   Lab Results  Component Value Date   HGBA1C 6.4 (H) 09/25/2021      Assessment & Plan:     1.Hypercalcemia - His calcium is stable at 11.4 mg/dl, will recheck in 1 month and if elevated with refer to Endocrinology. - Comp Met (CMET); Future - Gamma GT; Future - Urinalysis; Future  2. Prediabetes - His HgbA1c was 6.4%, his Metformin was increased to 500 mg bid, he was encouraged to continue on low carb/non concentrated sweet diet and exercise as tolerated. - metFORMIN (GLUCOPHAGE) 1000 MG tablet; Take 0.5 tablets (500 mg total) by mouth 2 (two) times daily with a meal.  Dispense: 60 tablet; Refill: 3     Follow-up: Return in about 5 weeks (around 10/31/2021), or if symptoms worsen or fail to improve.    Chioma E Iloabachie, NP 

## 2021-10-24 ENCOUNTER — Other Ambulatory Visit: Payer: Medicaid Other

## 2021-10-24 ENCOUNTER — Other Ambulatory Visit: Payer: Self-pay

## 2021-10-25 LAB — URINALYSIS
Bilirubin, UA: NEGATIVE
Glucose, UA: NEGATIVE
Ketones, UA: NEGATIVE
Leukocytes,UA: NEGATIVE
Nitrite, UA: NEGATIVE
Protein,UA: NEGATIVE
RBC, UA: NEGATIVE
Specific Gravity, UA: 1.017 (ref 1.005–1.030)
Urobilinogen, Ur: 0.2 mg/dL (ref 0.2–1.0)
pH, UA: 6.5 (ref 5.0–7.5)

## 2021-10-25 LAB — COMPREHENSIVE METABOLIC PANEL
ALT: 26 IU/L (ref 0–44)
AST: 24 IU/L (ref 0–40)
Albumin/Globulin Ratio: 1.8 (ref 1.2–2.2)
Albumin: 4.7 g/dL (ref 3.8–4.9)
Alkaline Phosphatase: 154 IU/L — ABNORMAL HIGH (ref 44–121)
BUN/Creatinine Ratio: 14 (ref 9–20)
BUN: 17 mg/dL (ref 6–24)
Bilirubin Total: 1.2 mg/dL (ref 0.0–1.2)
CO2: 22 mmol/L (ref 20–29)
Calcium: 11.7 mg/dL — ABNORMAL HIGH (ref 8.7–10.2)
Chloride: 106 mmol/L (ref 96–106)
Creatinine, Ser: 1.18 mg/dL (ref 0.76–1.27)
Globulin, Total: 2.6 g/dL (ref 1.5–4.5)
Glucose: 98 mg/dL (ref 70–99)
Potassium: 5 mmol/L (ref 3.5–5.2)
Sodium: 141 mmol/L (ref 134–144)
Total Protein: 7.3 g/dL (ref 6.0–8.5)
eGFR: 74 mL/min/{1.73_m2} (ref >59)

## 2021-10-25 LAB — GAMMA GT: GGT: 38 IU/L (ref 0–65)

## 2021-10-31 ENCOUNTER — Encounter: Payer: Self-pay | Admitting: Gerontology

## 2021-10-31 ENCOUNTER — Other Ambulatory Visit: Payer: Self-pay

## 2021-10-31 ENCOUNTER — Ambulatory Visit: Payer: Medicaid Other | Admitting: Gerontology

## 2021-10-31 DIAGNOSIS — H538 Other visual disturbances: Secondary | ICD-10-CM | POA: Insufficient documentation

## 2021-10-31 MED ORDER — VITAMIN D3 50 MCG (2000 UT) PO CAPS: 2000.0000 [IU] | ORAL_CAPSULE | Freq: Every day | ORAL | 3 refills | Status: AC

## 2021-10-31 NOTE — Patient Instructions (Signed)
Preventing Type 2 Diabetes Mellitus °Type 2 diabetes, also called type 2 diabetes mellitus, is a long-term (chronic) disease that affects sugar (glucose) levels in your blood. Normally, a hormone called insulin allows glucose to enter cells in your body. The cells use glucose for energy. With type 2 diabetes, you will have one or both of these problems: °Your pancreas does not make enough insulin. °Cells in your body do not respond properly to insulin that your body makes (insulin resistance). °Insulin resistance or lack of insulin causes extra glucose to build up in the blood instead of going into cells. As a result, high blood glucose (hyperglycemia) develops. That can cause many complications. Being overweight or obese and having an inactive (sedentary) lifestyle can increase your risk for diabetes. Type 2 diabetes can be delayed or prevented by making certain nutrition and lifestyle changes. °How can this condition affect me? °If you do not take steps to prevent diabetes, your blood glucose levels may keep increasing over time. Too much glucose in your blood for a long time can damage your blood vessels, heart, kidneys, nerves, and eyes. °Type 2 diabetes can lead to chronic health problems and complications, such as: °Heart disease. °Stroke. °Blindness. °Kidney disease. °Depression. °Poor circulation in your feet and legs. In severe cases, a foot or leg may need to be surgically removed (amputated). °What can increase my risk? °You may be more likely to develop type 2 diabetes if you: °Have type 2 diabetes in your family. °Are overweight or obese. °Have a sedentary lifestyle. °Have insulin resistance or a history of prediabetes. °Have a history of pregnancy-related (gestational) diabetes or polycystic ovary syndrome (PCOS). °What actions can I take to prevent this? °It can be difficult to recognize signs of type 2 diabetes. Taking action to prevent the disease before you develop symptoms is the best way to avoid  possible damage to your body. Making certain nutrition and lifestyle changes may prevent or delay the disease and related health problems. °Nutrition ° °Eat healthy meals and snacks regularly. Do not skip meals. Fruit or a handful of nuts is a healthy snack between meals. °Drink water throughout the day. Avoid drinks that contain added sugar, such as soda or sweetened tea. Drink enough fluid to keep your urine pale yellow. °Follow instructions from your health care provider about eating or drinking restrictions. °Limit the amount of food you eat by: °Managing how much you eat at a time (portion size). °Checking food labels for the serving sizes of food. °Using a kitchen scale to weigh amounts of food. °Sauté or steam food instead of frying it. Cook with water or broth instead of oils or butter. °Limit saturated fat and salt (sodium) in your diet. Have no more than 1 tsp (2,400 mg) of sodium a day. If you have heart disease or high blood pressure, use less than ½?¾ tsp (1,500 mg) of sodium a day. °Lifestyle ° °Lose weight if needed and as told. Your health care provider can determine how much weight loss is best for you and can help you lose weight safely. °If you are overweight or obese, you may be told to lose at least 5?7% of your body weight. °Manage blood pressure, cholesterol, and stress. Your health care provider will help determine the best treatment for you. °Do not use any products that contain nicotine or tobacco. These products include cigarettes, chewing tobacco, and vaping devices, such as e-cigarettes. If you need help quitting, ask your health care provider. °Activity ° °Do physical   activity that makes your heart beat faster and makes you sweat (moderate intensity). Do this for at least 30 minutes on at least 5 days of the week, or as much as told by your health care provider. °Ask your health care provider what activities are safe for you. A mix of activities may be best, such as walking, swimming,  cycling, and strength training. °Try to add physical activity into your day. For example: °Park your car farther away than usual so that you walk more. °Take a walk during your lunch break. °Use stairs instead of elevators or escalators. °Walk or bike to work instead of driving. °Alcohol use °If you drink alcohol: °Limit how much you have to: °0?1 drink a day for women who are not pregnant. °0?2 drinks a day for men. °Know how much alcohol is in your drink. In the [place] one drink equals one 12 oz bottle of beer (355 mL), one 5 oz glass of wine (148 mL), or one 1½ oz glass of hard liquor (44 mL). °General information °Talk with your health care provider about your risk factors and how you can reduce your risk for diabetes. °Have your blood glucose tested regularly, as told by your health care provider. °Get screening tests as told by your health care provider. You may have these regularly, especially if you have certain risk factors for type 2 diabetes. °Make an appointment with a registered dietitian. This diet and nutrition specialist can help you make a healthy eating plan and help you understand portion sizes and food labels. °Where to find support °Ask your health care provider to recommend a registered dietitian, a certified diabetes care and education specialist, or a weight loss program. °Look for local or online weight loss groups. °Join a gym, fitness club, or outdoor activity group, such as a walking club. °Where to find more information °For help and guidance and to learn more about diabetes and diabetes prevention, visit: °American Diabetes Association (ADA): www.diabetes.org °National Institute of Diabetes and Digestive and Kidney Diseases: www.niddk.nih.gov °To learn more about healthy eating, visit: °[place].S. Department of Agriculture (USDA): www.choosemyplate.gov °Office of Disease Prevention and Health Promotion (ODPHP): health.gov °Summary °You can delay or prevent type 2 diabetes by eating healthy  foods, losing weight if needed, and increasing your physical activity. °Talk with your health care provider about your risk factors for type 2 diabetes and how you can reduce your risk. °It can be difficult to recognize the signs of type 2 diabetes. The best way to avoid possible damage to your body is to take action to prevent the disease before you develop symptoms. °Get screening tests as told by your health care provider. °This information is not intended to replace advice given to you by your health care provider. Make sure you discuss any questions you have with your health care provider. °Document Revised: 02/25/2021 Document Reviewed: 02/25/2021 °Elsevier Patient Education © 2022 Elsevier Inc. ° °

## 2021-10-31 NOTE — Progress Notes (Signed)
Established Patient Office Visit  Subjective:  Patient ID: Roberto Moore, male    DOB: 1971-12-05  Age: 50 y.o. MRN: [id number]  CC:  Chief Complaint  Patient presents with   Follow-up    Labs drawn 10/24/21    HPI Roberto Moore is a 50 year old male with a history of prediabetes who presents for follow up of hypercalcemia. Labs drawn 10/24/21 show persistently elevated calcium of 11.7 mg/dL and PTH of 50 pg/mL with alkaline phosphatase of 154 iu/L and GGT 38 iu/L. He denies bone pain and joint pain. He is taking over the counter  1000 IU of Vitamin D3 supplement daily. He asks for a referral to have his eyes checked. He previously had prescription glasses, but lost them 2-3 years ago. Denies difficulty seeing at night or far away. Other than this, he states that he is doing well and has no further complaints.   Past Medical History:  Diagnosis Date   Allergy    Carpal tunnel syndrome    Insomnia 08/05/2016   Osteoarthritis of both hands 07/14/2017    Past Surgical History:  Procedure Laterality Date   CHOLECYSTECTOMY      Family History  Problem Relation Age of Onset   Rheum arthritis Mother    Cancer Father    Arthritis Brother     Social History   Socioeconomic History   Marital status: Significant Other    Spouse name: Not on file   Number of children: Not on file   Years of education: Not on file   Highest education level: Not on file  Occupational History    Employer: [employer]  Tobacco Use   Smoking status: Never   Smokeless tobacco: Never  Vaping Use   Vaping Use: Never used  Substance and Sexual Activity   Alcohol use: Yes    Alcohol/week: 2.0 standard drinks    Types: 2 Glasses of wine per week   Drug use: No   Sexual activity: Not Currently  Other Topics Concern   Not on file  Social History Narrative   Not on file   Social Determinants of Health   Financial Resource Strain: Low Risk    Difficulty of Paying Living Expenses: Not hard at all  Food  Insecurity: No Food Insecurity   Worried About Charity fundraiser in the Last Year: Never true   [place] in the Last Year: Never true  Transportation Needs: No Transportation Needs   Lack of Transportation (Medical): No   Lack of Transportation (Non-Medical): No  Physical Activity: Sufficiently Active   Days of Exercise per Week: 3 days   Minutes of Exercise per Session: 60 min  Stress: Stress Concern Present   Feeling of Stress : To some extent  Social Connections: Unknown   Frequency of Communication with Friends and Family: More than three times a week   Frequency of Social Gatherings with Friends and Family: More than three times a week   Attends Religious Services: More than 4 times per year   Active Member of [employer] or Organizations: Yes   Attends Music therapist: More than 4 times per year   Marital Status: Not on file  Intimate Partner Violence: Not At Risk   Fear of Current or Ex-Partner: No   Emotionally Abused: No   Physically Abused: No   Sexually Abused: No    Outpatient Medications Prior to Visit  Medication Sig Dispense Refill   metFORMIN (GLUCOPHAGE) 1000 MG tablet  Take 0.5 tablets (500 mg total) by mouth 2 (two) times daily with a meal. 60 tablet 3   Cholecalciferol (VITAMIN D3 PO) Take 1 tablet by mouth daily.     ibuprofen (ADVIL) 800 MG tablet Take 1 tablet (800 mg total) by mouth 2 (two) times daily. 14 tablet 0   No facility-administered medications prior to visit.    Allergies  Allergen Reactions   Bee Pollen     ROS Review of Systems  Constitutional:  Negative for chills, fatigue and fever.  Eyes:  Positive for visual disturbance (blurry vision when reading).  Respiratory:  Negative for cough, chest tightness and shortness of breath.   Cardiovascular:  Negative for chest pain, palpitations and leg swelling.  Gastrointestinal:  Negative for anal bleeding and blood in stool.  Endocrine: Negative for cold intolerance, heat  intolerance, polydipsia, polyphagia and polyuria.  Genitourinary:  Negative for hematuria.  Neurological:  Negative for dizziness, weakness and headaches.     Objective:    Physical Exam Constitutional:      General: He is not in acute distress.    Appearance: Normal appearance. He is normal weight. He is not ill-appearing.  HENT:     Head: Normocephalic and atraumatic.  Eyes:     General: No scleral icterus. Cardiovascular:     Rate and Rhythm: Normal rate and regular rhythm.     Pulses: Normal pulses.     Heart sounds: Normal heart sounds. No murmur heard.   No friction rub. No gallop.  Pulmonary:     Effort: Pulmonary effort is normal.     Breath sounds: Normal breath sounds. No wheezing, rhonchi or rales.  Abdominal:     General: Abdomen is flat. Bowel sounds are normal.     Palpations: Abdomen is soft.  Musculoskeletal:        General: Normal range of motion.     Right lower leg: No edema.     Left lower leg: No edema.  Skin:    General: Skin is warm and dry.     Capillary Refill: Capillary refill takes less than 2 seconds.  Neurological:     General: No focal deficit present.     Mental Status: He is alert and oriented to person, place, and time.  Psychiatric:        Mood and Affect: Mood normal.    BP 119/85 (BP Location: Right Arm, Patient Position: Sitting, Cuff Size: Large)   Pulse 68   Temp 97.9 F (36.6 C) (Oral)   Resp 16   Ht 5' 8"  (1.727 m)   Wt 188 lb 3.2 oz (85.4 kg)   SpO2 97%   BMI 28.62 kg/m  Wt Readings from Last 3 Encounters:  10/31/21 188 lb 3.2 oz (85.4 kg)  09/26/21 190 lb 8 oz (86.4 kg)  09/03/21 187 lb 11.2 oz (85.1 kg)     Health Maintenance Due  Topic Date Due   Pneumococcal Vaccine 71-66 Years old (1 - PCV) Never done   Hepatitis C Screening  Never done   COLONOSCOPY (Pts 45-29yr Insurance coverage will need to be confirmed)  Never done   Zoster Vaccines- Shingrix (1 of 2) Never done   COVID-19 Vaccine (4 - Booster)  03/22/2021   INFLUENZA VACCINE  Never done    There are no preventive care reminders to display for this patient.  Lab Results  Component Value Date   TSH 1.850 03/21/2021   Lab Results  Component Value Date   WBC  6.7 03/21/2021   HGB 15.2 03/21/2021   HCT 46.1 03/21/2021   MCV 81 03/21/2021   PLT 291 03/21/2021   Lab Results  Component Value Date   NA 141 10/24/2021   K 5.0 10/24/2021   CO2 22 10/24/2021   GLUCOSE 98 10/24/2021   BUN 17 10/24/2021   CREATININE 1.18 10/24/2021   BILITOT 1.2 10/24/2021   ALKPHOS 154 (H) 10/24/2021   AST 24 10/24/2021   ALT 26 10/24/2021   PROT 7.3 10/24/2021   ALBUMIN 4.7 10/24/2021   CALCIUM 11.7 (H) 10/24/2021   ANIONGAP 5 07/15/2020   EGFR 74 10/24/2021   Lab Results  Component Value Date   CHOL 157 03/21/2021   Lab Results  Component Value Date   HDL 50 03/21/2021   Lab Results  Component Value Date   LDLCALC 93 03/21/2021   Lab Results  Component Value Date   TRIG 75 03/21/2021   Lab Results  Component Value Date   CHOLHDL 2.7 07/14/2017   Lab Results  Component Value Date   HGBA1C 6.4 (H) 09/25/2021      Assessment & Plan:    1. Hypercalcemia - Persistently elevated calcium (10.6-11.7 mg/dL).  - Cholecalciferol (VITAMIN D3) 50 MCG (2000 UT) capsule; Take 1 capsule (2,000 Units total) by mouth daily.  Dispense: 30 capsule; Refill: 3 - He was encouraged to complete [facility] financial application for Ambulatory referral to Endocrinology  2. Blurry vision, bilateral - Patient advised to go to [facility] for vision screening and bring back the prescription to [facility]. Advised to go to the ED if his symptoms worsen.    Follow-up: Return in about 3 months (around 01/29/2022).    Harvin Hazel, RN

## 2021-12-25 ENCOUNTER — Encounter: Payer: Self-pay | Admitting: "Endocrinology

## 2021-12-25 ENCOUNTER — Other Ambulatory Visit: Payer: Self-pay

## 2021-12-25 ENCOUNTER — Ambulatory Visit (INDEPENDENT_AMBULATORY_CARE_PROVIDER_SITE_OTHER): Payer: Self-pay | Admitting: "Endocrinology

## 2021-12-25 NOTE — Progress Notes (Signed)
Endocrinology Consult Note       12/25/2021, 12:37 PM  Roberto Moore is a 51 y.o.-year-old male, referred by his  Iloabachie, Chioma E, NP  , for evaluation for hypercalcemia/hyperparathyroidism.   Past Medical History:  Diagnosis Date   Allergy    Carpal tunnel syndrome    Insomnia 08/05/2016   Osteoarthritis of both hands 07/14/2017    Past Surgical History:  Procedure Laterality Date   CHOLECYSTECTOMY      Social History   Tobacco Use   Smoking status: Never   Smokeless tobacco: Never  Vaping Use   Vaping Use: Never used  Substance Use Topics   Alcohol use: Yes    Alcohol/week: 2.0 standard drinks    Types: 2 Glasses of wine per week   Drug use: No    Family History  Problem Relation Age of Onset   Rheum arthritis Mother    Cancer Father    Arthritis Brother     Outpatient Encounter Medications as of 12/25/2021  Medication Sig   Cholecalciferol (VITAMIN D3) 50 MCG (2000 UT) capsule Take 1 capsule (2,000 Units total) by mouth daily.   metFORMIN (GLUCOPHAGE) 1000 MG tablet Take 0.5 tablets (500 mg total) by mouth 2 (two) times daily with a meal.   No facility-administered encounter medications on file as of 12/25/2021.    Allergies  Allergen Reactions   Bee Pollen      HPI  Roberto Moore was diagnosed with hypercalcemia for the last 6 months.   Patient has no previously known history of parathyroid, pituitary, adrenal dysfunctions; no family history of such dysfunctions. -Review of hisreferral package of most recent labs reveals calcium of 11.7 mg per DL most recently.  In October 2022 his calcium was 11.4 associated with PTH of 50.  He has several instances of calcium above 11 mg per DL. He did not have 24-hour urine calcium measurements nor bone density.  No prior history of fragility fractures or falls. No history of  kidney stones. No history of CKD, recent labs normal kidney function.  He did have  vitamin D deficiency currently on supplements with correction up to 30.  he is not on HCTZ or other thiazide therapy.  he is not on calcium supplements,  he eats dairy and green, leafy, vegetables on average amounts.  he does not have a family history of hypercalcemia, pituitary tumors, thyroid cancer, or osteoporosis.  He denies any history of height loss.  I reviewed his chart and he also has a history of prediabetes on metformin.   ROS:  Constitutional: no weight gain/loss, no fatigue, no subjective hyperthermia, no subjective hypothermia Eyes: no blurry vision, no xerophthalmia ENT: no sore throat, no nodules palpated in throat, no dysphagia/odynophagia, no hoarseness Cardiovascular: no Chest Pain, no Shortness of Breath, no palpitations, no leg swelling Respiratory: no cough, no shortness of breath  Gastrointestinal: no Nausea/Vomiting/Diarhhea Musculoskeletal: no muscle/joint aches Skin: no rashes Neurological: no tremors, no numbness, no tingling, no dizziness Psychiatric: no depression, no anxiety  PE: BP 120/82    Pulse 64    Ht 5\' 8"  (1.727 m)    Wt 189 lb 12.8 oz (86.1  kg)    BMI 28.86 kg/m , Body mass index is 28.86 kg/m. Wt Readings from Last 3 Encounters:  12/25/21 189 lb 12.8 oz (86.1 kg)  10/31/21 188 lb 3.2 oz (85.4 kg)  09/26/21 190 lb 8 oz (86.4 kg)    Constitutional: BMI of 28.8, not in acute distress, normal state of mind Eyes: PERRLA, EOMI, no exophthalmos ENT: moist mucous membranes, no gross thyromegaly, no gross cervical lymphadenopathy Cardiovascular: normal precordial activity, Regular Rate and Rhythm, no Murmur/Rubs/Gallops Respiratory:  adequate breathing efforts, no gross chest deformity, Clear to auscultation bilaterally Gastrointestinal: abdomen soft, Non -tender, No distension, Bowel Sounds present Musculoskeletal: no gross deformities, strength intact in all four extremities Skin: moist, warm, no rashes Neurological: no tremor with  outstretched hands, Deep tendon reflexes normal in bilateral lower extremities.     CMP ( most recent) CMP     Component Value Date/Time   NA 141 10/24/2021 1809   K 5.0 10/24/2021 1809   CL 106 10/24/2021 1809   CO2 22 10/24/2021 1809   GLUCOSE 98 10/24/2021 1809   GLUCOSE 113 (H) 07/15/2020 2111   BUN 17 10/24/2021 1809   CREATININE 1.18 10/24/2021 1809   CREATININE 1.22 11/13/2016 0951   CALCIUM 11.7 (H) 10/24/2021 1809   PROT 7.3 10/24/2021 1809   ALBUMIN 4.7 10/24/2021 1809   AST 24 10/24/2021 1809   ALT 26 10/24/2021 1809   ALKPHOS 154 (H) 10/24/2021 1809   BILITOT 1.2 10/24/2021 1809   GFRNONAA >60 07/15/2020 2111   GFRNONAA 70 11/13/2016 0951   GFRAA >60 07/15/2020 2111   GFRAA 81 11/13/2016 0951     Diabetic Labs (most recent): Lab Results  Component Value Date   HGBA1C 6.4 (H) 09/25/2021   HGBA1C 6.2 (H) 06/06/2021   HGBA1C 6.5 (H) 03/21/2021     Lipid Panel ( most recent) Lipid Panel     Component Value Date/Time   CHOL 157 03/21/2021 1846   TRIG 75 03/21/2021 1846   HDL 50 03/21/2021 1846   CHOLHDL 2.7 07/14/2017 1344   CHOLHDL 3.5 11/13/2016 0951   VLDL 15 11/13/2016 0951   LDLCALC 93 03/21/2021 1846   LABVLDL 14 03/21/2021 1846      Lab Results  Component Value Date   TSH 1.850 03/21/2021      Assessment: 1. Hypercalcemia  Plan: Patient has had several instances of elevated calcium, with the highest level being at 11.7 mg per DL, corresponding PTH of 50.  - Patient also  has vitamin D deficiency, on treatment with the latest vitamin D at 30.   - No apparent complications from hypercalcemia/hyperparathyroidism: no history of  nephrolithiasis,  osteoporosis,fragility fractures. No abdominal pain, no major mood disorders, no bone pain.  - I discussed with the patient about the physiology of calcium and parathyroid hormone, and possible  effects of  increased PTH/ Calcium , including kidney stones, cardiac dysrhythmias, osteoporosis,  abdominal pain, etc.   - The work up so far is not sufficient to reach a conclusion for definitive therapy.  he  needs more studies to confirm and classify the parathyroid dysfunction he may have. I will proceed to obtain 24-hour urine calcium measurement.  It is  essential to obtain 24-hour urine calcium/creatinine to rule out the rare but important cause of mild elevation in calcium and PTH- FHH ( Familial Hypocalciuric Hypercalcemia), which may not require any active intervention.  -If he is returning with hypercalciuria, he will be considered for bone density as well as repeat  PTH/calcium, serum magnesium and phosphorus   Is advised to maintain close follow-up with his PCP. - Time spent with the patient: 60 minutes, of which >50% was spent in obtaining information about his symptoms, reviewing his previous labs, evaluations, and treatments, counseling him about his hypercalcemia, vitamin D deficiency, and developing a plan to confirm the diagnosis and long term treatment as necessary.  Please refer to " Patient Self Inventory" in the Media  tab for reviewed elements of pertinent patient history.  Orson Eva participated in the discussions, expressed understanding, and voiced agreement with the above plans.  All questions were answered to his satisfaction. he is encouraged to contact clinic should he have any questions or concerns prior to his return visit.  - Return in about 10 days (around 01/04/2022) for 24 Hr Urine Ca & Cr.   Marquis Lunch, MD [facility] [address] [place], [place] [address] Phone: [phone]  Fax: [phone]    This note was partially dictated with voice recognition software. Similar sounding words can be transcribed inadequately or may not  be corrected upon review.  12/25/2021, 12:37 PM

## 2021-12-29 LAB — CREATININE, URINE, 24 HOUR
Creatinine, 24H Ur: 3429 mg/24 hr — ABNORMAL HIGH (ref 1000–2000)
Creatinine, Urine: 228.6 mg/dL

## 2021-12-29 LAB — CALCIUM, URINE, 24 HOUR
Calcium, 24H Urine: 415 mg/24 hr — ABNORMAL HIGH (ref 0–320)
Calcium, Urine: 34.6 mg/dL

## 2022-01-08 ENCOUNTER — Ambulatory Visit (INDEPENDENT_AMBULATORY_CARE_PROVIDER_SITE_OTHER): Payer: Self-pay | Admitting: "Endocrinology

## 2022-01-08 ENCOUNTER — Other Ambulatory Visit: Payer: Self-pay

## 2022-01-08 ENCOUNTER — Encounter: Payer: Self-pay | Admitting: "Endocrinology

## 2022-01-08 DIAGNOSIS — E210 Primary hyperparathyroidism: Secondary | ICD-10-CM

## 2022-01-08 NOTE — Progress Notes (Signed)
01/08/2022, 1:04 PM  Roberto Moore is a 51 y.o.-year-old male, referred by his  [place], Chioma E, NP  , for evaluation for hypercalcemia/hyperparathyroidism.   Past Medical History:  Diagnosis Date   Allergy    Carpal tunnel syndrome    Insomnia 08/05/2016   Osteoarthritis of both hands 07/14/2017    Past Surgical History:  Procedure Laterality Date   CHOLECYSTECTOMY      Social History   Tobacco Use   Smoking status: Never   Smokeless tobacco: Never  Vaping Use   Vaping Use: Never used  Substance Use Topics   Alcohol use: Yes    Alcohol/week: 2.0 standard drinks    Types: 2 Glasses of wine per week   Drug use: No    Family History  Problem Relation Age of Onset   Rheum arthritis Mother    Cancer Father    Arthritis Brother     Outpatient Encounter Medications as of 01/08/2022  Medication Sig   Cholecalciferol (VITAMIN D3) 50 MCG (2000 UT) capsule Take 1 capsule (2,000 Units total) by mouth daily.   metFORMIN (GLUCOPHAGE) 1000 MG tablet Take 0.5 tablets (500 mg total) by mouth 2 (two) times daily with a meal. (Patient taking differently: Take 1,000 mg by mouth 2 (two) times daily with a meal.)   No facility-administered encounter medications on file as of 01/08/2022.    Allergies  Allergen Reactions   Bee Pollen      HPI  Roberto Moore was diagnosed with hypercalcemia for the last 6 months.   Patient has no previously known history of parathyroid, pituitary, adrenal dysfunctions; no family history of such dysfunctions. See note from previous visit.  He has history of several elevations of his calcium above normal limits.  His most recent labs show calcium of 11.7 associated with PTH of 50. He was sent for 24-hour urine collection for calcium measurement which showed 415 mg / 24 hours. -He does not have bone density yet.  No prior history of fragility fractures or falls. No history of  kidney  stones. No history of CKD, recent labs normal kidney function.  He did have vitamin D deficiency currently on supplements with correction up to 30.  he is not on HCTZ or other thiazide therapy.  he is not on calcium supplements,  he eats dairy and green, leafy, vegetables on average amounts.  he does not have a family history of hypercalcemia, pituitary tumors, thyroid cancer, or osteoporosis.  He denies any history of height loss.  I reviewed his chart and he also has a history of prediabetes on metformin.   ROS:  Constitutional: no weight gain/loss, no fatigue, no subjective hyperthermia, no subjective hypothermia Eyes: no blurry vision, no xerophthalmia ENT: no sore throat, no nodules palpated in throat, no dysphagia/odynophagia, no hoarseness Cardiovascular: no Chest Pain, no Shortness of Breath, no palpitations, no leg swelling Respiratory: no cough, no shortness of breath  Gastrointestinal: no Nausea/Vomiting/Diarhhea Musculoskeletal: no muscle/joint aches Skin: no rashes Neurological: no tremors, no numbness, no tingling, no dizziness Psychiatric: no depression, no anxiety  PE: BP 120/82    Pulse (!) 56  Ht 5\' 8"  (1.727 m)    Wt 187 lb 6.4 oz (85 kg)    BMI 28.49 kg/m , Body mass index is 28.49 kg/m. Wt Readings from Last 3 Encounters:  01/08/22 187 lb 6.4 oz (85 kg)  12/25/21 189 lb 12.8 oz (86.1 kg)  10/31/21 188 lb 3.2 oz (85.4 kg)    Constitutional: BMI of 28.8, not in acute distress, normal state of mind Eyes: PERRLA, EOMI, no exophthalmos ENT: moist mucous membranes, no gross thyromegaly, no gross cervical lymphadenopathy    CMP ( most recent) CMP     Component Value Date/Time   NA 141 10/24/2021 1809   K 5.0 10/24/2021 1809   CL 106 10/24/2021 1809   CO2 22 10/24/2021 1809   GLUCOSE 98 10/24/2021 1809   GLUCOSE 113 (H) 07/15/2020 2111   BUN 17 10/24/2021 1809   CREATININE 1.18 10/24/2021 1809   CREATININE 1.22 11/13/2016 0951   CALCIUM 11.7 (H)  10/24/2021 1809   PROT 7.3 10/24/2021 1809   ALBUMIN 4.7 10/24/2021 1809   AST 24 10/24/2021 1809   ALT 26 10/24/2021 1809   ALKPHOS 154 (H) 10/24/2021 1809   BILITOT 1.2 10/24/2021 1809   GFRNONAA >60 07/15/2020 2111   GFRNONAA 70 11/13/2016 0951   GFRAA >60 07/15/2020 2111   GFRAA 81 11/13/2016 0951     Diabetic Labs (most recent): Lab Results  Component Value Date   HGBA1C 6.4 (H) 09/25/2021   HGBA1C 6.2 (H) 06/06/2021   HGBA1C 6.5 (H) 03/21/2021     Lipid Panel ( most recent) Lipid Panel     Component Value Date/Time   CHOL 157 03/21/2021 1846   TRIG 75 03/21/2021 1846   HDL 50 03/21/2021 1846   CHOLHDL 2.7 07/14/2017 1344   CHOLHDL 3.5 11/13/2016 0951   VLDL 15 11/13/2016 0951   LDLCALC 93 03/21/2021 1846   LABVLDL 14 03/21/2021 1846      Lab Results  Component Value Date   TSH 1.850 03/21/2021      Assessment: 1. Hypercalcemia  Plan: Patient has had several instances of elevated calcium, with the highest level being at 11.7 mg per DL, corresponding PTH of 50. His previsit 24-hour urine calcium is elevated at 415. - Patient also  has vitamin D deficiency, on treatment with the latest vitamin D at 30.   Work-up so far has indicating likely primary hyperparathyroidism/hypercalcemia. -no history of  nephrolithiasis,  osteoporosis,fragility fractures. No abdominal pain, no major mood disorders, no bone pain. He would benefit from surgical intervention.  He and his fiance were in clinic and agree with plan.  I will arrange for him to see Dr. Darnell Levelodd Gerkin in the next several weeks.  On a later date, after his surgery, will be considered for bone density.  He is advised to maintain close follow-up with his PCP.   I spent 22 minutes in the care of the patient today including review of labs from Thyroid Function, CMP, and other relevant labs ; imaging/biopsy records (current and previous including abstractions from other facilities); face-to-face time discussing   his lab results and symptoms, medications doses, his options of short and long term treatment based on the latest standards of care / guidelines;   and documenting the encounter.  Orson EvaJames Buckels  participated in the discussions, expressed understanding, and voiced agreement with the above plans.  All questions were answered to his satisfaction. he is encouraged to contact clinic should he have any questions or concerns prior to his return  visit.   - Return in about 7 weeks (around 02/26/2022) for F/U with Labs after Surgery.   Marquis Lunch, MD [facility] [address] [place], [place] [address] Phone: [phone]  Fax: [phone]    This note was partially dictated with voice recognition software. Similar sounding words can be transcribed inadequately or may not  be corrected upon review.  01/08/2022, 1:04 PM

## 2022-01-29 ENCOUNTER — Ambulatory Visit: Payer: Medicaid Other | Admitting: Gerontology

## 2022-01-30 ENCOUNTER — Other Ambulatory Visit: Payer: Self-pay

## 2022-01-30 ENCOUNTER — Ambulatory Visit: Payer: Medicaid Other | Admitting: Family Medicine

## 2022-01-30 DIAGNOSIS — Z0000 Encounter for general adult medical examination without abnormal findings: Secondary | ICD-10-CM | POA: Insufficient documentation

## 2022-01-30 DIAGNOSIS — E119 Type 2 diabetes mellitus without complications: Secondary | ICD-10-CM | POA: Insufficient documentation

## 2022-01-30 DIAGNOSIS — H538 Other visual disturbances: Secondary | ICD-10-CM

## 2022-01-30 NOTE — Assessment & Plan Note (Signed)
Saw Dr. Fransico Him who suspects primary hyperparathyroidism Referred to Dr. Gerrit Friends for surgery

## 2022-01-30 NOTE — Assessment & Plan Note (Signed)
Looks to be borderline Continue metformin He and fiance working on [employer] He'll need diabetic eye exam and foot exam in future.

## 2022-01-30 NOTE — Assessment & Plan Note (Signed)
He's working on getting his vision screening

## 2022-01-30 NOTE — Progress Notes (Signed)
[facility]  Subjective:  Patient ID: Roberto Moore, male    DOB: 1971-04-11  Age: 51 y.o. MRN: [id number]  CC:  Chief Complaint  Patient presents with   Follow-up    Calcium follow up      HPI Roberto Moore presents for follow up.  Really no concerns today, just following up on calcium and glasses.  Asking about charity care application.  Hasn't gone to get glasses yet.    He saw Dr. Dorris Fetch and has been referred to see Dr. Harlow Asa for parathyroidectomy.    Past Medical History:  Diagnosis Date   Allergy    Carpal tunnel syndrome    Insomnia 08/05/2016   Osteoarthritis of both hands 07/14/2017    Past Surgical History:  Procedure Laterality Date   CHOLECYSTECTOMY      Family History  Problem Relation Age of Onset   Rheum arthritis Mother    Cancer Father    Arthritis Brother     Social History   Socioeconomic History   Marital status: Significant Other    Spouse name: Not on file   Number of children: Not on file   Years of education: Not on file   Highest education level: Not on file  Occupational History    Employer: [employer]  Tobacco Use   Smoking status: Never   Smokeless tobacco: Never  Vaping Use   Vaping Use: Never used  Substance and Sexual Activity   Alcohol use: Yes    Alcohol/week: 2.0 standard drinks    Types: 2 Glasses of wine per week   Drug use: No   Sexual activity: Not Currently  Other Topics Concern   Not on file  Social History Narrative   Not on file   Social Determinants of Health   Financial Resource Strain: Low Risk    Difficulty of Paying Living Expenses: Not hard at all  Food Insecurity: No Food Insecurity   Worried About Charity fundraiser in the Last Year: Never true   [place] in the Last Year: Never true  Transportation Needs: No Transportation Needs   Lack of Transportation (Medical): No   Lack of Transportation (Non-Medical): No  Physical Activity: Sufficiently Active   Days of Exercise per Week: 3 days    Minutes of Exercise per Session: 60 min  Stress: Stress Concern Present   Feeling of Stress : To some extent  Social Connections: Unknown   Frequency of Communication with Friends and Family: More than three times [place] Boy week   Frequency of Social Gatherings with Friends and Family: More than three times [place] Magner week   Attends Religious Services: More than 4 times per year   Active Member of [employer] or Organizations: Yes   Attends Music therapist: More than 4 times per year   Marital Status: Not on file  Intimate Partner Violence: Not At Risk   Fear of Current or Ex-Partner: No   Emotionally Abused: No   Physically Abused: No   Sexually Abused: No    Outpatient Medications Prior to Visit  Medication Sig Dispense Refill   Cholecalciferol (VITAMIN D3) 50 MCG (2000 UT) capsule Take 1 capsule (2,000 Units total) by mouth daily. 30 capsule 3   metFORMIN (GLUCOPHAGE) 1000 MG tablet Take 0.5 tablets (500 mg total) by mouth 2 (two) times daily with [place] Schroader meal. (Patient taking differently: Take 1,000 mg by mouth 2 (two) times daily with Myron Stankovich meal.) 60 tablet 3   No facility-administered medications  prior to visit.    Allergies  Allergen Reactions   Bee Pollen     ROS Review of Systems  Respiratory:  Negative for shortness of breath.   Cardiovascular:  Negative for chest pain.  Gastrointestinal:  Negative for abdominal pain.  Genitourinary:  Negative for testicular pain.  Musculoskeletal:  Negative for myalgias.     Objective:    Physical Exam Constitutional:      Appearance: Normal appearance. He is not ill-appearing or toxic-appearing.  Cardiovascular:     Rate and Rhythm: Normal rate and regular rhythm.  Pulmonary:     Effort: Pulmonary effort is normal.     Breath sounds: Normal breath sounds.  Skin:    General: Skin is warm and dry.  Neurological:     General: No focal deficit present.     Mental Status: He is alert and oriented to person, place, and time.    BP 121/80  (BP Location: Left Arm, Patient Position: Sitting)    Pulse 62    Temp 98 F (36.7 C) (Oral)    Resp 16    Ht _0  (1.753 m)    Wt 187 lb (84.8 kg)    SpO2 98%    BMI 27.62 kg/m  Wt Readings from Last 3 Encounters:  01/30/22 187 lb (84.8 kg)  01/08/22 187 lb 6.4 oz (85 kg)  12/25/21 189 lb 12.8 oz (86.1 kg)     Health Maintenance Due  Topic Date Due   FOOT EXAM  Never done   OPHTHALMOLOGY EXAM  Never done   Hepatitis C Screening  Never done   COLONOSCOPY (Pts 45-19yr Insurance coverage will need to be confirmed)  Never done   Zoster Vaccines- Shingrix (1 of 2) Never done   COVID-19 Vaccine (4 - Booster) 03/22/2021   INFLUENZA VACCINE  Never done    There are no preventive care reminders to display for this patient.  Lab Results  Component Value Date   TSH 1.850 03/21/2021   Lab Results  Component Value Date   WBC 6.7 03/21/2021   HGB 15.2 03/21/2021   HCT 46.1 03/21/2021   MCV 81 03/21/2021   PLT 291 03/21/2021   Lab Results  Component Value Date   NA 141 10/24/2021   K 5.0 10/24/2021   CO2 22 10/24/2021   GLUCOSE 98 10/24/2021   BUN 17 10/24/2021   CREATININE 1.18 10/24/2021   BILITOT 1.2 10/24/2021   ALKPHOS 154 (H) 10/24/2021   AST 24 10/24/2021   ALT 26 10/24/2021   PROT 7.3 10/24/2021   ALBUMIN 4.7 10/24/2021   CALCIUM 11.7 (H) 10/24/2021   ANIONGAP 5 07/15/2020   EGFR 74 10/24/2021   Lab Results  Component Value Date   CHOL 157 03/21/2021   Lab Results  Component Value Date   HDL 50 03/21/2021   Lab Results  Component Value Date   LDLCALC 93 03/21/2021   Lab Results  Component Value Date   TRIG 75 03/21/2021   Lab Results  Component Value Date   CHOLHDL 2.7 07/14/2017   Lab Results  Component Value Date   HGBA1C 6.4 (H) 09/25/2021      Assessment & Plan:   Problem List Items Addressed This Visit       Endocrine   Type 2 diabetes mellitus without complications ([place]    Looks to be borderline Continue metformin He and  fiance working on healthy diet He'll need diabetic eye exam and foot exam in future.  Other   Hypercalcemia    Saw Dr. Dorris Fetch who suspects primary hyperparathyroidism Referred to Dr. Harlow Asa for surgery      Blurry vision, bilateral    He's working on getting his vision screening      Health care maintenance    Health Maintenance Due  Topic Date Due   FOOT EXAM  Never done   OPHTHALMOLOGY EXAM  Never done   Hepatitis C Screening  Never done   COLONOSCOPY (Pts 45-97yr Insurance coverage will need to be confirmed)  Never done   Zoster Vaccines- Shingrix (1 of 2) Never done   COVID-19 Vaccine (4 - Booster) 03/22/2021   INFLUENZA VACCINE  Never done  Need to discuss updating health maintenance once charity care comes through        No orders of the defined types were placed in this encounter.   Follow-up: Return in about 3 months (around 04/29/2022).    CFayrene Helper MD

## 2022-01-30 NOTE — Patient Instructions (Addendum)
We'll check on your charity care  Please go to [facility] for your vision screening and bring the prescription back here.

## 2022-01-30 NOTE — Assessment & Plan Note (Signed)
Health Maintenance Due  Topic Date Due   FOOT EXAM  Never done   OPHTHALMOLOGY EXAM  Never done   Hepatitis C Screening  Never done   COLONOSCOPY (Pts 45-13yrs Insurance coverage will need to be confirmed)  Never done   Zoster Vaccines- Shingrix (1 of 2) Never done   COVID-19 Vaccine (4 - Booster) 03/22/2021   INFLUENZA VACCINE  Never done   Need to discuss updating health maintenance once charity care comes through

## 2022-02-26 ENCOUNTER — Ambulatory Visit: Payer: Medicaid Other | Admitting: "Endocrinology

## 2022-03-31 ENCOUNTER — Other Ambulatory Visit: Payer: Self-pay | Admitting: Gerontology

## 2022-03-31 DIAGNOSIS — R7303 Prediabetes: Secondary | ICD-10-CM

## 2022-04-01 ENCOUNTER — Other Ambulatory Visit: Payer: Self-pay | Admitting: Gerontology

## 2022-04-29 ENCOUNTER — Ambulatory Visit: Payer: Medicaid Other | Admitting: Gerontology

## 2022-05-01 ENCOUNTER — Ambulatory Visit: Payer: Medicaid Other

## 2023-09-22 ENCOUNTER — Ambulatory Visit: Payer: 59 | Admitting: Physician Assistant

## 2023-09-22 ENCOUNTER — Encounter: Payer: Self-pay | Admitting: Physician Assistant

## 2023-09-22 VITALS — BP 127/81 | HR 56 | Ht 68.0 in | Wt 183.0 lb

## 2023-09-22 DIAGNOSIS — Z7689 Persons encountering health services in other specified circumstances: Secondary | ICD-10-CM

## 2023-09-22 DIAGNOSIS — Z1159 Encounter for screening for other viral diseases: Secondary | ICD-10-CM

## 2023-09-22 DIAGNOSIS — Z0000 Encounter for general adult medical examination without abnormal findings: Secondary | ICD-10-CM

## 2023-09-22 DIAGNOSIS — H538 Other visual disturbances: Secondary | ICD-10-CM

## 2023-09-22 DIAGNOSIS — Z0001 Encounter for general adult medical examination with abnormal findings: Secondary | ICD-10-CM

## 2023-09-22 DIAGNOSIS — E119 Type 2 diabetes mellitus without complications: Secondary | ICD-10-CM

## 2023-09-22 DIAGNOSIS — Z125 Encounter for screening for malignant neoplasm of prostate: Secondary | ICD-10-CM

## 2023-09-22 DIAGNOSIS — Z23 Encounter for immunization: Secondary | ICD-10-CM | POA: Diagnosis not present

## 2023-09-22 DIAGNOSIS — Z1211 Encounter for screening for malignant neoplasm of colon: Secondary | ICD-10-CM

## 2023-09-22 DIAGNOSIS — E559 Vitamin D deficiency, unspecified: Secondary | ICD-10-CM

## 2023-09-22 DIAGNOSIS — M545 Low back pain, unspecified: Secondary | ICD-10-CM
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
## 2023-09-23 ENCOUNTER — Encounter: Payer: Self-pay | Admitting: Physician Assistant

## 2023-09-24 LAB — CBC WITH DIFFERENTIAL/PLATELET
Basophils Absolute: 0 10*3/uL (ref 0.0–0.2)
Basos: 0 %
EOS (ABSOLUTE): 0.1 10*3/uL (ref 0.0–0.4)
Eos: 1 %
Hematocrit: 53.8 % — ABNORMAL HIGH (ref 37.5–51.0)
Hemoglobin: 16.6 g/dL (ref 13.0–17.7)
Immature Grans (Abs): 0 10*3/uL (ref 0.0–0.1)
Immature Granulocytes: 0 %
Lymphocytes Absolute: 2 10*3/uL (ref 0.7–3.1)
Lymphs: 18 %
MCH: 26.1 pg — ABNORMAL LOW (ref 26.6–33.0)
MCHC: 30.9 g/dL — ABNORMAL LOW (ref 31.5–35.7)
MCV: 85 fL (ref 79–97)
Monocytes Absolute: 0.9 10*3/uL (ref 0.1–0.9)
Monocytes: 9 %
Neutrophils Absolute: 7.7 10*3/uL — ABNORMAL HIGH (ref 1.4–7.0)
Neutrophils: 72 %
Platelets: 299 10*3/uL (ref 150–450)
RBC: 6.35 x10E6/uL — ABNORMAL HIGH (ref 4.14–5.80)
RDW: 12.6 % (ref 11.6–15.4)
WBC: 10.7 10*3/uL (ref 3.4–10.8)

## 2023-09-24 LAB — COMPREHENSIVE METABOLIC PANEL
ALT: 23 [IU]/L (ref 0–44)
AST: 16 [IU]/L (ref 0–40)
Albumin: 4.5 g/dL (ref 3.8–4.9)
Alkaline Phosphatase: 132 [IU]/L — ABNORMAL HIGH (ref 44–121)
BUN/Creatinine Ratio: 11 (ref 9–20)
BUN: 15 mg/dL (ref 6–24)
Bilirubin Total: 2.2 mg/dL — ABNORMAL HIGH (ref 0.0–1.2)
CO2: 24 mmol/L (ref 20–29)
Calcium: 11.5 mg/dL — ABNORMAL HIGH (ref 8.7–10.2)
Chloride: 107 mmol/L — ABNORMAL HIGH (ref 96–106)
Creatinine, Ser: 1.33 mg/dL — ABNORMAL HIGH (ref 0.76–1.27)
Globulin, Total: 2.8 g/dL (ref 1.5–4.5)
Glucose: 126 mg/dL — ABNORMAL HIGH (ref 70–99)
Potassium: 5.3 mmol/L — ABNORMAL HIGH (ref 3.5–5.2)
Sodium: 141 mmol/L (ref 134–144)
Total Protein: 7.3 g/dL (ref 6.0–8.5)
eGFR: 64 mL/min/{1.73_m2} (ref >59)

## 2023-09-24 LAB — PSA: Prostate Specific Ag, Serum: 1.9 ng/mL (ref 0.0–4.0)

## 2023-09-24 LAB — LIPID PANEL WITH LDL/HDL RATIO
Cholesterol, Total: 142 mg/dL (ref 100–199)
HDL: 49 mg/dL (ref >39)
LDL Chol Calc (NIH): 79 mg/dL (ref 0–99)
LDL/HDL Ratio: 1.6 {ratio} (ref 0.0–3.6)
Triglycerides: 68 mg/dL (ref 0–149)
VLDL Cholesterol Cal: 14 mg/dL (ref 5–40)

## 2023-09-24 LAB — HEMOGLOBIN A1C
Est. average glucose Bld gHb Est-mCnc: 131 mg/dL
Hgb A1c MFr Bld: 6.2 % — ABNORMAL HIGH (ref 4.8–5.6)

## 2023-09-24 LAB — HEPATITIS C ANTIBODY: Hep C Virus Ab: NONREACTIVE

## 2023-09-24 LAB — VITAMIN D 25 HYDROXY (VIT D DEFICIENCY, FRACTURES): Vit D, 25-Hydroxy: 17.2 ng/mL — ABNORMAL LOW (ref 30.0–100.0)

## 2023-09-28 MED ORDER — VITAMIN D (ERGOCALCIFEROL) 1.25 MG (50000 UNIT) PO CAPS
50000.0000 [IU] | ORAL_CAPSULE | ORAL | 0 refills | Status: AC
Start: 2023-09-28 — End: ?

## 2023-09-28 NOTE — Addendum Note (Signed)
Addended by: Debera Lat on: 09/28/2023 03:08 PM   Modules accepted: Orders

## 2023-09-30 ENCOUNTER — Encounter: Payer: Self-pay | Admitting: *Deleted

## 2023-10-01 ENCOUNTER — Telehealth: Payer: Self-pay

## 2023-10-01 NOTE — Telephone Encounter (Signed)
Copied from CRM [id number]. Topic: General - Other >> Oct 01, 2023  3:07 PM Macon Large wrote: Reason for CRM: Pt spouse requests that the pt be contacted to go over most recent lab results.

## 2023-10-25 NOTE — Progress Notes (Unsigned)
Complete physical exam  Patient: Roberto Moore   DOB: [birth year]   52 y.o. Male  MRN: [id number] Visit Date: 10/27/2023  Today's healthcare provider: Debera Lat, [name]   No chief complaint on file.  Subjective    Roberto Moore is a 52 y.o. male who presents today for a complete physical exam.  He reports consuming a {diet types:17450} diet. {Exercise:19826} He generally feels {well/fairly well/poorly:18703}. He reports sleeping {well/fairly well/poorly:18703}. He {does/does not:200015} have additional problems to discuss today.  HPI  *** Discussed the use of AI scribe software for clinical note transcription with the patient, who gave verbal consent to proceed.  History of Present Illness            Last depression screening scores    10/31/2021    4:16 PM 10/23/2017    2:54 PM 07/14/2017   11:41 AM  PHQ 2/9 Scores  PHQ - 2 Score 0 0 1  PHQ- 9 Score  0 2   Last fall risk screening     No data to display         Last Audit-C alcohol use screening    03/21/2021    6:21 PM  Alcohol Use Disorder Test (AUDIT)  1. How often do you have a drink containing alcohol? 4  2. How many drinks containing alcohol do you have on a typical day when you are drinking? 0  3. How often do you have six or more drinks on one occasion? 0  AUDIT-C Score 4  4. How often during the last year have you found that you were not able to stop drinking once you had started? 0  5. How often during the last year have you failed to do what was normally expected from you because of drinking? 0  6. How often during the last year have you needed a first drink in the morning to get yourself going after a heavy drinking session? 0  7. How often during the last year have you had a feeling of guilt of remorse after drinking? 0  8. How often during the last year have you been unable to remember what happened the night before because you had been drinking? 0  9. Have you or someone else been injured as a  result of your drinking? 0  10. Has a relative or friend or a doctor or another health worker been concerned about your drinking or suggested you cut down? 0  Alcohol Use Disorder Identification Test Final Score (AUDIT) 4   A score of 3 or more in women, and 4 or more in men indicates increased risk for alcohol abuse, EXCEPT if all of the points are from question 1   Past Medical History:  Diagnosis Date   Allergy    Carpal tunnel syndrome    Insomnia 08/05/2016   Osteoarthritis of both hands 07/14/2017   Past Surgical History:  Procedure Laterality Date   CHOLECYSTECTOMY     Social History   Socioeconomic History   Marital status: Significant Other    Spouse name: Not on file   Number of children: Not on file   Years of education: Not on file   Highest education level: Not on file  Occupational History    Employer: [employer]  Tobacco Use   Smoking status: Never   Smokeless tobacco: Never  Vaping Use   Vaping status: Never Used  Substance and Sexual Activity   Alcohol use: Yes    Alcohol/week: 2.0 standard  drinks of alcohol    Types: 2 Glasses of wine per week   Drug use: No   Sexual activity: Not Currently  Other Topics Concern   Not on file  Social History Narrative   Not on file   Social Determinants of Health   Financial Resource Strain: Low Risk  (03/21/2021)   Overall Financial Resource Strain (CARDIA)    Difficulty of Paying Living Expenses: Not hard at all  Food Insecurity: No Food Insecurity (03/21/2021)   Hunger Vital Sign    Worried About Running Out of Food in the Last Year: Never true    Ran Out of Food in the Last Year: Never true  Transportation Needs: No Transportation Needs (03/21/2021)   PRAPARE - Administrator, Civil Service (Medical): No    Lack of Transportation (Non-Medical): No  Physical Activity: Sufficiently Active (03/21/2021)   Exercise Vital Sign    Days of Exercise per Week: 3 days    Minutes of Exercise per Session: 60 min   Stress: Stress Concern Present (03/21/2021)   [employer] of Occupational Health - Occupational Stress Questionnaire    Feeling of Stress : To some extent  Social Connections: Unknown (03/21/2021)   Social Connection and Isolation Panel [NHANES]    Frequency of Communication with Friends and Family: More than three times a week    Frequency of Social Gatherings with Friends and Family: More than three times a week    Attends Religious Services: More than 4 times per year    Active Member of Clubs or Organizations: Yes    Attends Banker Meetings: More than 4 times per year    Marital Status: Not on file  Intimate Partner Violence: Not At Risk (03/21/2021)   Humiliation, Afraid, Rape, and Kick questionnaire    Fear of Current or Ex-Partner: No    Emotionally Abused: No    Physically Abused: No    Sexually Abused: No   Family Status  Relation Name Status   Mother  Deceased   Father  Deceased   Brother  Alive   MGM  Deceased   MGF  Deceased   PGM  Deceased   PGF  Deceased  No partnership data on file   Family History  Problem Relation Age of Onset   Rheum arthritis Mother    Cancer Father    Arthritis Brother    Allergies  Allergen Reactions   Bee Pollen     Patient Care Team: Debera Lat, [name] as PCP - General (Physician Assistant) Debbe Odea, MD as PCP - Cardiology (Cardiology)   Medications: Outpatient Medications Prior to Visit  Medication Sig   Cholecalciferol (VITAMIN D3) 50 MCG (2000 UT) capsule Take 1 capsule (2,000 Units total) by mouth daily. (Patient not taking: Reported on 09/22/2023)   metFORMIN (GLUCOPHAGE) 1000 MG tablet TAKE 0.5 TABLETS (500 MG TOTAL) BY MOUTH 2 (TWO) TIMES DAILY WITH A MEAL. (Patient not taking: Reported on 09/22/2023)   Vitamin D, Ergocalciferol, (DRISDOL) 1.25 MG (50000 UNIT) CAPS capsule Take 1 capsule (50,000 Units total) by mouth every 7 (seven) days.   No facility-administered medications prior to visit.     Review of Systems  All other systems reviewed and are negative.  Except see HPI  {Insert previous labs (optional):23779} {See past labs  Heme  Chem  Endocrine  Serology  Results Review (optional):1}  Objective    There were no vitals taken for this visit. {Insert last BP/Wt (optional):23777}{See vitals history (optional):1}  Physical Exam Vitals reviewed.  Constitutional:      General: He is not in acute distress.    Appearance: Normal appearance. He is well-developed. He is not ill-appearing, toxic-appearing or diaphoretic.  HENT:     Head: Normocephalic and atraumatic.     Right Ear: Tympanic membrane, ear canal and external ear normal.     Left Ear: Tympanic membrane, ear canal and external ear normal.     Nose: Nose normal. No congestion or rhinorrhea.     Mouth/Throat:     Mouth: Mucous membranes are moist.     Pharynx: Oropharynx is clear. No oropharyngeal exudate.  Eyes:     General: No scleral icterus.       Right eye: No discharge.        Left eye: No discharge.     Conjunctiva/sclera: Conjunctivae normal.     Pupils: Pupils are equal, round, and reactive to light.  Neck:     Thyroid: No thyromegaly.     Vascular: No carotid bruit.  Cardiovascular:     Rate and Rhythm: Normal rate and regular rhythm.     Pulses: Normal pulses.     Heart sounds: Normal heart sounds. No murmur heard.    No friction rub. No gallop.  Pulmonary:     Effort: Pulmonary effort is normal. No respiratory distress.     Breath sounds: Normal breath sounds. No wheezing or rales.  Abdominal:     General: Abdomen is flat. Bowel sounds are normal. There is no distension.     Palpations: Abdomen is soft. There is no mass.     Tenderness: There is no abdominal tenderness. There is no right CVA tenderness, left CVA tenderness, guarding or rebound.     Hernia: No hernia is present.  Musculoskeletal:        General: No swelling, tenderness, deformity or signs of injury. Normal range  of motion.     Cervical back: Normal range of motion and neck supple. No rigidity or tenderness.     Right lower leg: No edema.     Left lower leg: No edema.  Lymphadenopathy:     Cervical: No cervical adenopathy.  Skin:    General: Skin is warm and dry.     Coloration: Skin is not jaundiced or pale.     Findings: No bruising, erythema, lesion or rash.  Neurological:     Mental Status: He is alert and oriented to person, place, and time. Mental status is at baseline.     Gait: Gait normal.  Psychiatric:        Mood and Affect: Mood normal.        Behavior: Behavior normal.        Thought Content: Thought content normal.        Judgment: Judgment normal.      No results found for any visits on 10/27/23.  Assessment & Plan    Routine Health Maintenance and Physical Exam  Exercise Activities and Dietary recommendations  Goals   None     Immunization History  Administered Date(s) Administered   Influenza, Seasonal, Injecte, Preservative Fre 09/22/2023   Tdap 07/14/2017   Unspecified SARS-COV-2 Vaccination 06/21/2020, 07/21/2020, 01/25/2021   Zoster Recombinant(Shingrix) 09/22/2023    Health Maintenance  Topic Date Due   Complete foot exam   Never done   Eye exam for diabetics  Never done   Colon Cancer Screening  Never done   Yearly kidney health urinalysis for diabetes  09/19/2022  COVID-19 Vaccine (4 - 2023-24 season) 08/16/2023   Zoster (Shingles) Vaccine (2 of 2) 11/17/2023   Hemoglobin A1C  03/23/2024   Yearly kidney function blood test for diabetes  09/22/2024   DTaP/Tdap/Td vaccine (2 - Td or Tdap) 07/15/2027   Flu Shot  Completed   Hepatitis C Screening  Completed   HIV Screening  Completed   HPV Vaccine  Aged Out    Discussed health benefits of physical activity, and encouraged him to engage in regular exercise appropriate for his age and condition.  Assessment and Plan        1. Annual physical exam UTD on dental/eye Things to do to keep yourself  healthy  - Exercise at least 30-45 minutes a day, 3-4 days a week.  - Eat a low-fat diet with lots of fruits and vegetables, up to 7-9 servings per day.  - Seatbelts can save your life. Wear them always.  - Smoke detectors on every level of your home, check batteries every year.  - Eye Doctor - have an eye exam every 1-2 years  - Safe sex - if you may be exposed to STDs, use a condom.  - Alcohol -  If you drink, do it moderately, less than 2 drinks per day.  - Health Care Power of Attorney. Choose someone to speak for you if you are not able.  - Depression is common in our stressful world.If you're feeling down or losing interest in things you normally enjoy, please come in for a visit.  - Violence - If anyone is threatening or hurting you, please call immediately.    No follow-ups on file.    The patient was advised to call back or seek an in-person evaluation if the symptoms worsen or if the condition fails to improve as anticipated.  I discussed the assessment and treatment plan with the patient. The patient was provided an opportunity to ask questions and all were answered. The patient agreed with the plan and demonstrated an understanding of the instructions.  I, Debera Lat, [name] have reviewed all documentation for this visit. The documentation on  10/26/23  for the exam, diagnosis, procedures, and orders are all accurate and complete.  Debera Lat, [facility], MMS [facility] [phone] (phone) [phone] (fax)  [facility]

## 2023-10-27 ENCOUNTER — Encounter: Payer: Self-pay | Admitting: Physician Assistant

## 2023-10-27 ENCOUNTER — Ambulatory Visit (INDEPENDENT_AMBULATORY_CARE_PROVIDER_SITE_OTHER): Payer: 59 | Admitting: Physician Assistant

## 2023-10-27 VITALS — BP 135/81 | HR 76 | Resp 16 | Ht 68.0 in | Wt 185.0 lb

## 2023-10-27 DIAGNOSIS — Z0000 Encounter for general adult medical examination without abnormal findings: Secondary | ICD-10-CM | POA: Diagnosis not present

## 2023-12-20 NOTE — Progress Notes (Deleted)
    Roberto Console, [name] [address]  [address]  [place], [place] [address]  Main: [phone]  Fax: [phone]   Gastroenterology Consultation  Referring Provider:     Ostwalt, [name], [name] Primary Care Physician:  Roberto Moore, [name], [name] Primary Gastroenterologist:  *** Reason for Consultation:     Colonoscopy        HPI:   Roberto Moore is a 53 y.o. y/o male referred for consultation & management  by Roberto Moore, [name], [name].    No previous colonoscopy, EGD, or GI evaluation.  Age 27 and has never had a colonoscopy.  No family history of colon cancer.  GI symptoms:  Medical history significant for hypercalcemia, hyperparathyroidism, type 2 diabetes.  Previously saw endocrinologist who recommended surgical referral for surgical intervention (parathyroidectomy).  Prior cholecystectomy.  Past Medical History:  Diagnosis Date   Allergy    Carpal tunnel syndrome    Insomnia 08/05/2016   Osteoarthritis of both hands 07/14/2017    Past Surgical History:  Procedure Laterality Date   CHOLECYSTECTOMY      Prior to Admission medications   Medication Sig Start Date End Date Taking? Authorizing Provider  Cholecalciferol (VITAMIN D3) 50 MCG (2000 UT) capsule Take 1 capsule (2,000 Units total) by mouth daily. Patient not taking: Reported on 09/22/2023 10/31/21   Iloabachie, Chioma E, NP  metFORMIN  (GLUCOPHAGE ) 1000 MG tablet TAKE 0.5 TABLETS (500 MG TOTAL) BY MOUTH 2 (TWO) TIMES DAILY WITH A MEAL. Patient not taking: Reported on 09/22/2023 04/01/22   Iloabachie, Chioma E, NP  Vitamin D , Ergocalciferol , (DRISDOL ) 1.25 MG (50000 UNIT) CAPS capsule Take 1 capsule (50,000 Units total) by mouth every 7 (seven) days. 09/28/23   Roberto Moore, [name], [name]    Family History  Problem Relation Age of Onset   Rheum arthritis Mother    Cancer Father    Arthritis Brother      Social History   Tobacco Use   Smoking status: Never   Smokeless tobacco: Never  Vaping Use   Vaping status: Never Used   Substance Use Topics   Alcohol use: Yes    Alcohol/week: 2.0 standard drinks of alcohol    Types: 2 Glasses of wine per week   Drug use: No    Allergies as of 12/21/2023 - Review Complete 10/27/2023  Allergen Reaction Noted   Bee pollen  03/21/2021    Review of Systems:    All systems reviewed and negative except where noted in HPI.   Physical Exam:  There were no vitals taken for this visit. No LMP for male patient.  General:   Alert,  Well-developed, well-nourished, pleasant and cooperative in NAD Lungs:  Respirations even and unlabored.  Clear throughout to auscultation.   No wheezes, crackles, or rhonchi. No acute distress. Heart:  Regular rate and rhythm; no murmurs, clicks, rubs, or gallops. Abdomen:  Normal bowel sounds.  No bruits.  Soft, and non-distended without masses, hepatosplenomegaly or hernias noted.  No Tenderness.  No guarding or rebound tenderness.    Neurologic:  Alert and oriented x3;  grossly normal neurologically. Psych:  Alert and cooperative. Normal mood and affect.  Imaging Studies: No results found.  Assessment and Plan:   Roberto Moore is a 53 y.o. y/o male has been referred for ***  Follow up ***  Roberto Console, [name]    BP check ***

## 2023-12-21 ENCOUNTER — Ambulatory Visit: Payer: Medicaid Other | Admitting: Physician Assistant

## 2023-12-21 ENCOUNTER — Encounter: Payer: Self-pay | Admitting: Physician Assistant

## 2024-01-28 ENCOUNTER — Ambulatory Visit: Payer: Self-pay | Admitting: Physician Assistant

## 2024-01-28 NOTE — Progress Notes (Deleted)
 Established patient visit  Patient: Roberto Moore   DOB: 1971-09-21   53 y.o. Male  MRN: [id number] Visit Date: 01/28/2024  Today's healthcare provider: Debera Lat, [name]   No chief complaint on file.  Subjective       Discussed the use of AI scribe software for clinical note transcription with the patient, who gave verbal consent to proceed.  History of Present Illness               10/27/2023   10:45 AM 10/31/2021    4:16 PM 10/23/2017    2:54 PM  Depression screen PHQ 2/9  Decreased Interest 0 0 0  Down, Depressed, Hopeless 0 0 0  PHQ - 2 Score 0 0 0  Altered sleeping 0  0  Tired, decreased energy 0  0  Change in appetite 0  0  Feeling bad or failure about yourself  0  0  Trouble concentrating 0  0  Moving slowly or fidgety/restless 0  0  Suicidal thoughts 0  0  PHQ-9 Score 0  0  Difficult doing work/chores Not difficult at all        10/27/2023   10:46 AM 10/23/2017    2:54 PM 07/14/2017   11:42 AM 11/13/2016    9:29 AM  GAD 7 : Generalized Anxiety Score  Nervous, Anxious, on Edge 0 0 0 0  Control/stop worrying 0 0 0 0  Worry too much - different things 0 0 1 1  Trouble relaxing 0 0 1 0  Restless 0 0 0 0  Easily annoyed or irritable 0 0 0 0  Afraid - awful might happen 0 0 0 0  Total GAD 7 Score 0 0 2 1  Anxiety Difficulty Not difficult at all       Medications: Outpatient Medications Prior to Visit  Medication Sig   Cholecalciferol (VITAMIN D3) 50 MCG (2000 UT) capsule Take 1 capsule (2,000 Units total) by mouth daily. (Patient not taking: Reported on 09/22/2023)   metFORMIN (GLUCOPHAGE) 1000 MG tablet TAKE 0.5 TABLETS (500 MG TOTAL) BY MOUTH 2 (TWO) TIMES DAILY WITH A MEAL. (Patient not taking: Reported on 09/22/2023)   Vitamin D, Ergocalciferol, (DRISDOL) 1.25 MG (50000 UNIT) CAPS capsule Take 1 capsule (50,000 Units total) by mouth every 7 (seven) days.   No facility-administered medications prior to visit.    Review of Systems  All other  systems reviewed and are negative.  All negative Except see HPI   {Insert previous labs (optional):23779} {See past labs  Heme  Chem  Endocrine  Serology  Results Review (optional):1}   Objective    There were no vitals taken for this visit. {Insert last BP/Wt (optional):23777}{See vitals history (optional):1}   Physical Exam Vitals reviewed.  Constitutional:      General: He is not in acute distress.    Appearance: Normal appearance. He is not diaphoretic.  HENT:     Head: Normocephalic and atraumatic.  Eyes:     General: No scleral icterus.    Conjunctiva/sclera: Conjunctivae normal.  Cardiovascular:     Rate and Rhythm: Normal rate and regular rhythm.     Pulses: Normal pulses.     Heart sounds: Normal heart sounds. No murmur heard. Pulmonary:     Effort: Pulmonary effort is normal. No respiratory distress.     Breath sounds: Normal breath sounds. No wheezing or rhonchi.  Musculoskeletal:     Cervical back: Neck supple.     Right lower leg: No edema.  Left lower leg: No edema.  Lymphadenopathy:     Cervical: No cervical adenopathy.  Skin:    General: Skin is warm and dry.     Findings: No rash.  Neurological:     Mental Status: He is alert and oriented to person, place, and time. Mental status is at baseline.  Psychiatric:        Mood and Affect: Mood normal.        Behavior: Behavior normal.      No results found for any visits on 01/28/24.      Assessment and Plan             No orders of the defined types were placed in this encounter.   No follow-ups on file.   The patient was advised to call back or seek an in-person evaluation if the symptoms worsen or if the condition fails to improve as anticipated.  I discussed the assessment and treatment plan with the patient. The patient was provided an opportunity to ask questions and all were answered. The patient agreed with the plan and demonstrated an understanding of the instructions.  I,  Debera Lat, [name] have reviewed all documentation for this visit. The documentation on 01/28/2024  for the exam, diagnosis, procedures, and orders are all accurate and complete.  Debera Lat, [facility], MMS [facility] [phone] (phone) [phone] (fax)  [facility]

## 2024-04-15 ENCOUNTER — Ambulatory Visit
Admission: RE | Admit: 2024-04-15 | Discharge: 2024-04-15 | Disposition: A | Discharge status: Home / Self Care | Source: Ambulatory Visit | Attending: Family Medicine | Admitting: Family Medicine

## 2024-04-15 ENCOUNTER — Ambulatory Visit (INDEPENDENT_AMBULATORY_CARE_PROVIDER_SITE_OTHER): Payer: Self-pay | Admitting: Family Medicine

## 2024-04-15 ENCOUNTER — Ambulatory Visit
Admission: RE | Admit: 2024-04-15 | Discharge: 2024-04-15 | Disposition: A | Discharge status: Home / Self Care | Attending: Family Medicine | Admitting: Family Medicine

## 2024-04-15 ENCOUNTER — Encounter: Payer: Self-pay | Admitting: Family Medicine

## 2024-04-15 VITALS — BP 135/86 | HR 58 | Resp 16 | Ht 68.75 in | Wt 188.6 lb

## 2024-04-15 DIAGNOSIS — M546 Pain in thoracic spine: Secondary | ICD-10-CM | POA: Diagnosis present

## 2024-04-15 DIAGNOSIS — G8929 Other chronic pain: Secondary | ICD-10-CM | POA: Diagnosis present
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
## 2024-04-15 NOTE — Patient Instructions (Signed)
 Please review the attached list of medications and notify my office if there are any errors.   Go to [facility]) [facility] at [facility] for your Xrays (phone no. [phone])

## 2024-04-19 ENCOUNTER — Encounter: Payer: Self-pay | Admitting: Family Medicine

## 2024-10-27 ENCOUNTER — Encounter: Payer: Self-pay | Admitting: Physician Assistant

## 2025-01-04 ENCOUNTER — Other Ambulatory Visit: Payer: Self-pay

## 2025-01-04 ENCOUNTER — Emergency Department
Admission: EM | Admit: 2025-01-04 | Discharge: 2025-01-04 | Disposition: A | Discharge status: Home / Self Care | Payer: Worker's Compensation | Attending: Emergency Medicine | Admitting: Emergency Medicine

## 2025-01-04 ENCOUNTER — Encounter: Payer: Self-pay | Admitting: Emergency Medicine

## 2025-01-04 DIAGNOSIS — Y990 Civilian activity done for income or pay: Secondary | ICD-10-CM | POA: Diagnosis not present

## 2025-01-04 DIAGNOSIS — W228XXA Striking against or struck by other objects, initial encounter: Secondary | ICD-10-CM | POA: Insufficient documentation

## 2025-01-04 DIAGNOSIS — S0592XA Unspecified injury of left eye and orbit, initial encounter: Secondary | ICD-10-CM | POA: Diagnosis present

## 2025-01-04 MED ORDER — FLUORESCEIN SODIUM 1 MG OP STRP
1.0000 | ORAL_STRIP | Freq: Once | OPHTHALMIC | Status: AC
  Administered 2025-01-04: 1 via OPHTHALMIC
  Filled 2025-01-04: qty 1

## 2025-01-04 MED ORDER — TETRACAINE HCL 0.5 % OP SOLN
2.0000 [drp] | Freq: Once | OPHTHALMIC | Status: AC
  Administered 2025-01-04: 2 [drp] via OPHTHALMIC
  Filled 2025-01-04: qty 4

## 2025-01-04 NOTE — ED Notes (Signed)
 Per Pt, No drug screen testing is required by employer. Pt verbalizes understanding of discharge instructions. Opportunity for questioning and answers were provided. Pt discharged from ED at this time.

## 2025-01-04 NOTE — ED Triage Notes (Signed)
 Pt via POV from work. Pt was opening a box and the flap from the cardboard box hit him in the L eye. Since then, pt c/o pain in the L eye. No swelling. Pt is workers youth worker. Pt is A&OX4 and NAD, ambulatory to triage.

## 2025-01-04 NOTE — Discharge Instructions (Signed)
 Follow-up with your regular doctor as needed.  Follow-up with Dr. Fiacco if you begin to have eye pain.  No corneal abrasion was noted today.  If you have any change in your vision, increased pain please call [facility] for an appointment.  Return emergency department for worsening

## 2025-01-04 NOTE — ED Provider Notes (Signed)
 "  [facility] Provider Note    Event Date/Time   First MD Initiated Contact with Patient 01/04/25 1508     (approximate)   History   Eye Injury   HPI  Roberto Moore is a 54 y.o. male with no significant past medical history presents emergency department complaining of a left eye injury while at work.  States he was opening a box and flap of the cardboard knocked his glasses off and hit right at his eye.  States became concerned as he did have a little bit of pain.  States he wants to make sure his vision is okay and that there is no infection.      Physical Exam   Triage Vital Signs: ED Triage Vitals  Encounter Vitals Group     BP 01/04/25 1222 (!) 136/103     Girls Systolic BP Percentile --      Girls Diastolic BP Percentile --      Boys Systolic BP Percentile --      Boys Diastolic BP Percentile --      Pulse Rate 01/04/25 1222 (!) 58     Resp 01/04/25 1222 20     Temp 01/04/25 1222 98.3 F (36.8 C)     Temp Source 01/04/25 1222 Oral     SpO2 01/04/25 1222 95 %     Weight 01/04/25 1207 180 lb (81.6 kg)     Height 01/04/25 1207 5' 8.75 (1.746 m)     Head Circumference --      Peak Flow --      Pain Score 01/04/25 1207 2     Pain Loc --      Pain Education --      Exclude from Growth Chart --     Most recent vital signs: Vitals:   01/04/25 1222  BP: (!) 136/103  Pulse: (!) 58  Resp: 20  Temp: 98.3 F (36.8 C)  SpO2: 95%     General: Awake, no distress.   CV:  Good peripheral perfusion.  Resp:  Normal effort.  Abd:  No distention.   Other:  PERRL, EOMI, no redness noted, visual acuity normal, fluorescein  stain does not show any dye uptake, tetracaine  was used for anesthetic   ED Results / Procedures / Treatments   Labs (all labs ordered are listed, but only abnormal results are displayed) Labs Reviewed - No data to display   EKG     RADIOLOGY     PROCEDURES:   Procedures  Critical Care:  no Chief Complaint   Patient presents with   Eye Injury      MEDICATIONS ORDERED IN ED: Medications  tetracaine  (PONTOCAINE) 0.5 % ophthalmic solution 2 drop (2 drops Left Eye Given by Other 01/04/25 1530)  fluorescein  ophthalmic strip 1 strip (1 strip Left Eye Given by Other 01/04/25 1531)     IMPRESSION / MDM / ASSESSMENT AND PLAN / ED COURSE  I reviewed the triage vital signs and the nursing notes.                              Differential diagnosis includes, but is not limited to, corneal abrasion, globe injury, contusion,  Patient's presentation is most consistent with acute illness / injury with system symptoms.   Medications given: Tetracaine , or fluorescein  stain  Eye exams reassuring, no dye uptake.  Patient was given reassurance.  Follow-up with [facility] if any continued  eye pain.  Patient is given work note.  Return if worsening.  Discharged stable condition.  In agreement treatment plan      FINAL CLINICAL IMPRESSION(S) / ED DIAGNOSES   Final diagnoses:  Left eye injury, initial encounter     Rx / DC Orders   ED Discharge Orders     None        Note:  This document was prepared using Dragon voice recognition software and may include unintentional dictation errors.    Gasper Devere ORN, [name] 01/04/25 2027    Rexford Reche HERO, MD 01/04/25 2244  "
